# Patient Record
Sex: Male | Born: 1985 | Race: Black or African American | Hispanic: No | Marital: Single | Smoking: Never smoker
Health system: Southern US, Community
[De-identification: ages and names within clinical notes are randomized; demographics above are authoritative.]

---
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
## 2007-01-10 ENCOUNTER — Emergency Department (HOSPITAL_COMMUNITY): Admission: EM | Admit: 2007-01-10 | Discharge: 2007-01-10 | Payer: Self-pay | Admitting: Emergency Medicine

## 2010-03-15 ENCOUNTER — Emergency Department (HOSPITAL_COMMUNITY): Admission: EM | Admit: 2010-03-15 | Discharge: 2010-03-15 | Payer: Self-pay | Admitting: Emergency Medicine

## 2011-02-17 LAB — I-STAT 8, (EC8 V) (CONVERTED LAB)
Bicarbonate: 27 — ABNORMAL HIGH
Chloride: 104
Chloride: 104
HCT: 44
HCT: 44
Hemoglobin: 15
Operator id: 277751
Sodium: 140
TCO2: 29
TCO2: 29
pCO2, Ven: 52.4 — ABNORMAL HIGH
pH, Ven: 7.32 — ABNORMAL HIGH

## 2011-02-17 LAB — POCT I-STAT CREATININE
Creatinine, Ser: 1.2
Operator id: 277751

## 2011-02-17 LAB — DIFFERENTIAL
Basophils Absolute: 0.1
Basophils Relative: 2 — ABNORMAL HIGH
Eosinophils Absolute: 0
Neutrophils Relative %: 51

## 2011-02-17 LAB — CBC
MCHC: 33.3
MCV: 88.7
Platelets: 252
WBC: 3.7 — ABNORMAL LOW

## 2011-02-17 LAB — POCT CARDIAC MARKERS: Myoglobin, poc: 49.2

## 2015-05-05 ENCOUNTER — Emergency Department (HOSPITAL_COMMUNITY)
Admission: EM | Admit: 2015-05-05 | Discharge: 2015-05-05 | Disposition: A | Discharge status: Home / Self Care | Payer: Worker's Compensation | Attending: Physician Assistant | Admitting: Physician Assistant

## 2015-05-05 ENCOUNTER — Encounter (HOSPITAL_COMMUNITY): Payer: Self-pay | Admitting: *Deleted

## 2015-05-05 ENCOUNTER — Emergency Department (HOSPITAL_COMMUNITY): Payer: Worker's Compensation

## 2015-05-05 DIAGNOSIS — Y9389 Activity, other specified: Secondary | ICD-10-CM | POA: Insufficient documentation

## 2015-05-05 DIAGNOSIS — W230XXA Caught, crushed, jammed, or pinched between moving objects, initial encounter: Secondary | ICD-10-CM | POA: Diagnosis not present

## 2015-05-05 DIAGNOSIS — Y990 Civilian activity done for income or pay: Secondary | ICD-10-CM | POA: Insufficient documentation

## 2015-05-05 DIAGNOSIS — S62609A Fracture of unspecified phalanx of unspecified finger, initial encounter for closed fracture: Secondary | ICD-10-CM

## 2015-05-05 DIAGNOSIS — S62522A Displaced fracture of distal phalanx of left thumb, initial encounter for closed fracture: Secondary | ICD-10-CM | POA: Diagnosis not present

## 2015-05-05 DIAGNOSIS — S6992XA Unspecified injury of left wrist, hand and finger(s), initial encounter: Secondary | ICD-10-CM | POA: Diagnosis present

## 2015-05-05 DIAGNOSIS — Y9289 Other specified places as the place of occurrence of the external cause: Secondary | ICD-10-CM | POA: Insufficient documentation

## 2015-05-05 MED ORDER — HYDROCODONE-ACETAMINOPHEN 5-325 MG PO TABS
1.0000 | ORAL_TABLET | Freq: Once | ORAL | Status: AC
  Administered 2015-05-05: 1 via ORAL
  Filled 2015-05-05: qty 1

## 2015-05-05 MED ORDER — HYDROCODONE-ACETAMINOPHEN 5-325 MG PO TABS: ORAL_TABLET | ORAL | Status: DC

## 2015-05-05 NOTE — ED Provider Notes (Signed)
CSN: [id number]     Arrival date & time 05/05/15  1806 History  By signing my name below, I, Hector Wright, attest that this documentation has been prepared under the direction and in the presence of [employer] Trentyn Boisclair, PA-C. Electronically Signed: Evon Slackerrance Wright, ED Scribe. 05/05/2015. 9:27 PM.    Chief Complaint  Patient presents with  . Hand Injury   The history is provided by the patient. No language interpreter was used.   HPI Comments: Hector Wright is a 29 y.o. male who presents to the Emergency Department complaining of left thumb crush injury onset 1 day prior. Pt states he was unloading a truck at work and he was bringing the unloading ramp into the truck crushing his thumb. Pt states that he has associated swelling to the thumb. Pt presents with a blister on the thumb as well. He states that his pain is worse with any movement of the thumb. Pt states that he is right hand dominant. Pt denies any medications PTA. Pt denies numbness or tingling.   History reviewed. No pertinent past medical history. History reviewed. No pertinent past surgical history. No family history on file. Social History  Substance Use Topics  . Smoking status: Never Smoker   . Smokeless tobacco: None  . Alcohol Use: No    Review of Systems A complete 10 system review of systems was obtained and all systems are negative except as noted in the HPI and PMH.    Allergies  Review of patient's allergies indicates not on file.  Home Medications   Prior to Admission medications   Medication Sig Start Date End Date Taking? Authorizing Provider  HYDROcodone-acetaminophen (NORCO/VICODIN) 5-325 MG tablet Take 1-2 tablets by mouth every 6 hours as needed for pain and/or cough. 05/05/15   Hector Wilkerson, PA-C   BP 143/70 mmHg  Pulse 76  Temp(Src) 98.5 F (36.9 C) (Oral)  Resp 16  SpO2 100%   Physical Exam  Constitutional: He is oriented to person, place, and time. He appears well-developed and  well-nourished. No distress.  HENT:  Head: Normocephalic and atraumatic.  Eyes: Conjunctivae and EOM are normal.  Neck: Neck supple. No tracheal deviation present.  Cardiovascular: Normal rate.   Pulmonary/Chest: Effort normal. No respiratory distress.  Musculoskeletal: Normal range of motion. He exhibits edema and tenderness.       Hands: Neurological: He is alert and oriented to person, place, and time.  Skin: Skin is warm and dry.  Psychiatric: He has a normal mood and affect. His behavior is normal.  Nursing note and vitals reviewed.   ED Course  Procedures (including critical care time) DIAGNOSTIC STUDIES: Oxygen Saturation is 100% on RA, normal by my interpretation.    COORDINATION OF CARE: 8:58 PM-Discussed treatment plan with pt at bedside and pt agreed to plan.     Labs Review Labs Reviewed - No data to display  Imaging Review Dg Hand Complete Left  05/05/2015  CLINICAL DATA:  Hyperextension of the thumb with pain and swelling EXAM: LEFT HAND - COMPLETE 3+ VIEW COMPARISON:  None. FINDINGS: There is a fracture through the base of the first distal phalanx which appears comminuted and extends into the joint space. No other focal abnormality is noted. IMPRESSION: First distal phalangeal fracture. Electronically Signed   By: Hector CleverMark  Wright M.D.   On: 05/05/2015 19:45     EKG Interpretation None      MDM   Final diagnoses:  Phalanx (hand) fracture, closed, initial encounter  Filed Vitals:   05/05/15 1823  BP: 143/70  Pulse: 76  Temp: 98.5 F (36.9 C)  TempSrc: Oral  Resp: 16  SpO2: 100%    Medications  HYDROcodone-acetaminophen (NORCO/VICODIN) 5-325 MG per tablet 1 tablet (1 tablet Oral Given 05/05/15 2119)    Hector Wright is 29 y.o. male presenting with crush injury to left interphalangeal joint occurring yesterday. There is a blister over the joint with no laceration. X-ray shows the comminuted fracture extends into the joint.  Reconsult from  Dr. Mina Wright appreciated: States that patient can be placed in a standard splint and followed up in the office next week. Counseled patient on elevation of the extremity, return precautions. Work note provided.  Evaluation does not show pathology that would require ongoing emergent intervention or inpatient treatment. Pt is hemodynamically stable and mentating appropriately. Discussed findings and plan with patient/guardian, who agrees with care plan. All questions answered. Return precautions discussed and outpatient follow up given.   New Prescriptions   HYDROCODONE-ACETAMINOPHEN (NORCO/VICODIN) 5-325 MG TABLET    Take 1-2 tablets by mouth every 6 hours as needed for pain and/or cough.    I personally performed the services described in this documentation, which was scribed in my presence.  The recorded information has been reviewed and is accurate.      Hector Emery, PA-C 05/05/15 2130  Hector Randall An, MD 05/06/15 [phone]

## 2015-05-05 NOTE — Discharge Instructions (Signed)
Rest, Ice intermittently (in the first 24-48 hours), Gentle compression with an Ace wrap, and elevate (Limb above the level of the heart)   Take vicodin for breakthrough pain, do not drink alcohol, drive, care for children or do other critical tasks while taking vicodin.   Finger Fracture Fractures of fingers are breaks in the bones of the fingers. There are many types of fractures. There are different ways of treating these fractures. Your health care provider will discuss the best way to treat your fracture. CAUSES Traumatic injury is the main cause of broken fingers. These include:  Injuries while playing sports.  Workplace injuries.  Falls. RISK FACTORS Activities that can increase your risk of finger fractures include:  Sports.  Workplace activities that involve machinery.  A condition called osteoporosis, which can make your bones less dense and cause them to fracture more easily. SIGNS AND SYMPTOMS The main symptoms of a broken finger are pain and swelling within 15 minutes after the injury. Other symptoms include:  Bruising of your finger.  Stiffness of your finger.  Numbness of your finger.  Exposed bones (compound fracture) if the fracture is severe. DIAGNOSIS  The best way to diagnose a broken bone is with X-ray imaging. Additionally, your health care provider will use this X-ray image to evaluate the position of the broken finger bones.  TREATMENT  Finger fractures can be treated with:   Nonreduction--This means the bones are in place. The finger is splinted without changing the positions of the bone pieces. The splint is usually left on for about a week to 10 days. This will depend on your fracture and what your health care provider thinks.  Closed reduction--The bones are put back into position without using surgery. The finger is then splinted.  Open reduction and internal fixation--The fracture site is opened. Then the bone pieces are fixed into place with  pins or some type of hardware. This is seldom required. It depends on the severity of the fracture. HOME CARE INSTRUCTIONS   Follow your health care provider's instructions regarding activities, exercises, and physical therapy.  Only take over-the-counter or prescription medicines for pain, discomfort, or fever as directed by your health care provider. SEEK MEDICAL CARE IF: You have pain or swelling that limits the motion or use of your fingers. SEEK IMMEDIATE MEDICAL CARE IF:  Your finger becomes numb. MAKE SURE YOU:   Understand these instructions.  Will watch your condition.  Will get help right away if you are not doing well or get worse.   This information is not intended to replace advice given to you by your health care provider. Make sure you discuss any questions you have with your health care provider.   Document Released: 08/06/2000 Document Revised: 02/12/2013 Document Reviewed: 12/04/2012 Elsevier Interactive Patient Education [employer]2016 Elsevier Inc.

## 2015-05-05 NOTE — ED Notes (Signed)
Pt states he injured his thumb yesterday on a piece of equipment on his truck. Pt states he overextended his thumb. Pt has blister on thumb. Pt has limited ROM in his thumb.

## 2017-08-11 IMAGING — CR DG HAND COMPLETE 3+V*L*
3 series · 3 of 3 positions shown · non-contrast
Comparison: None.

CLINICAL DATA: Hyperextension of the thumb with pain and swelling

EXAM:
LEFT HAND - COMPLETE 3+ VIEW

[x hand pa left]
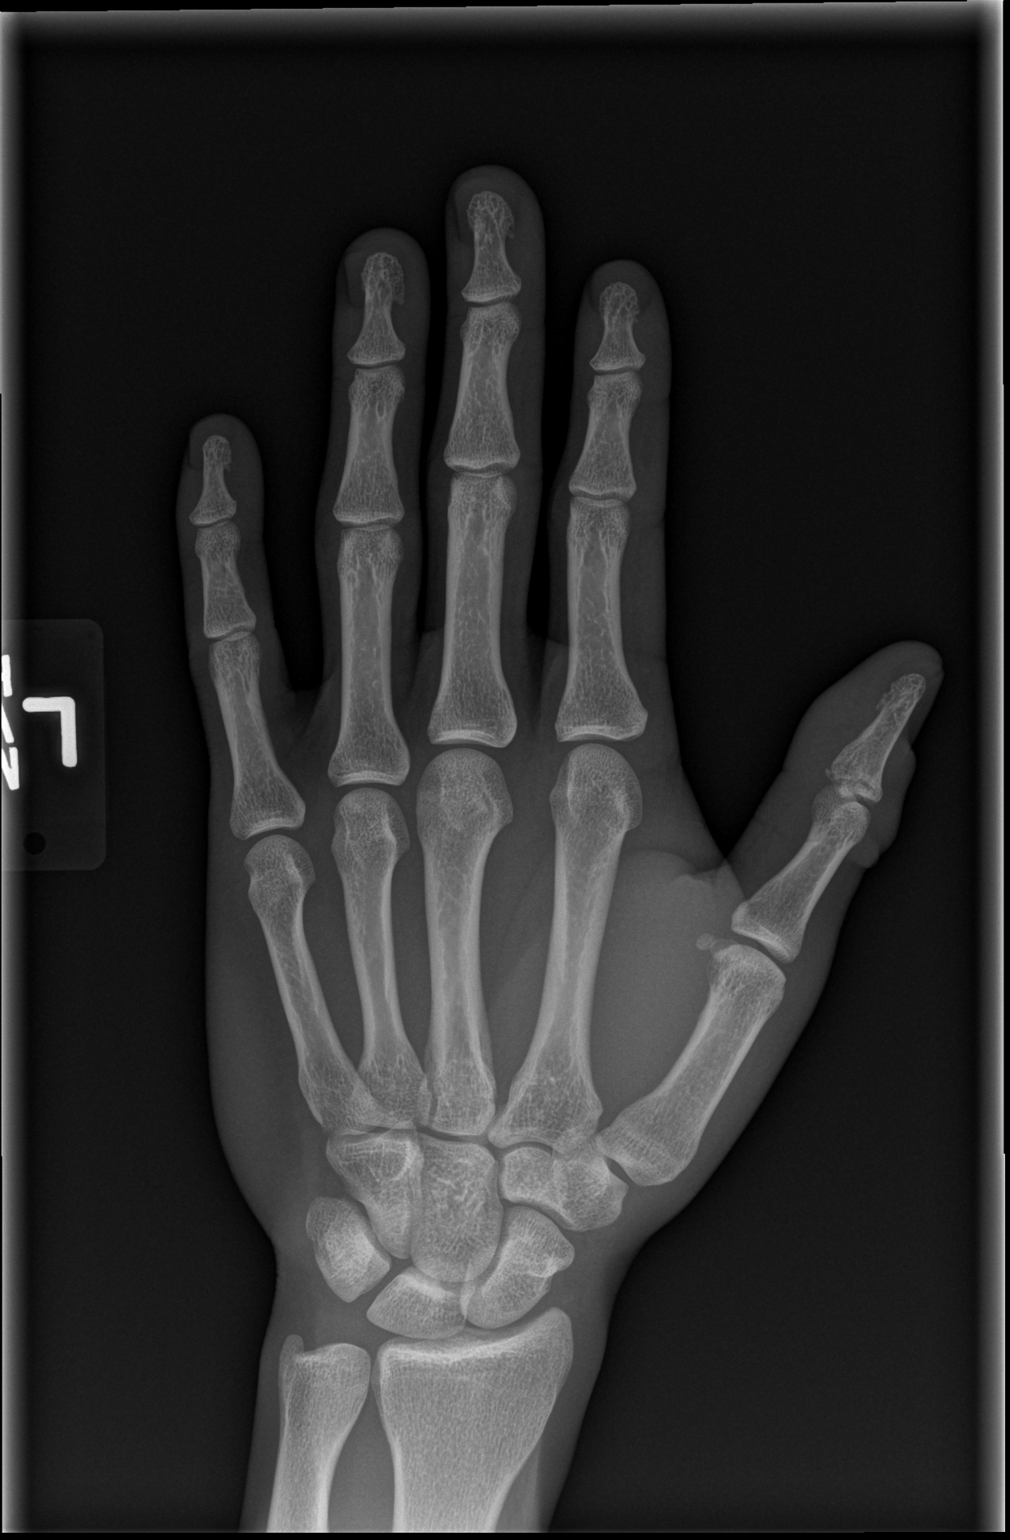

[x hand obl left]
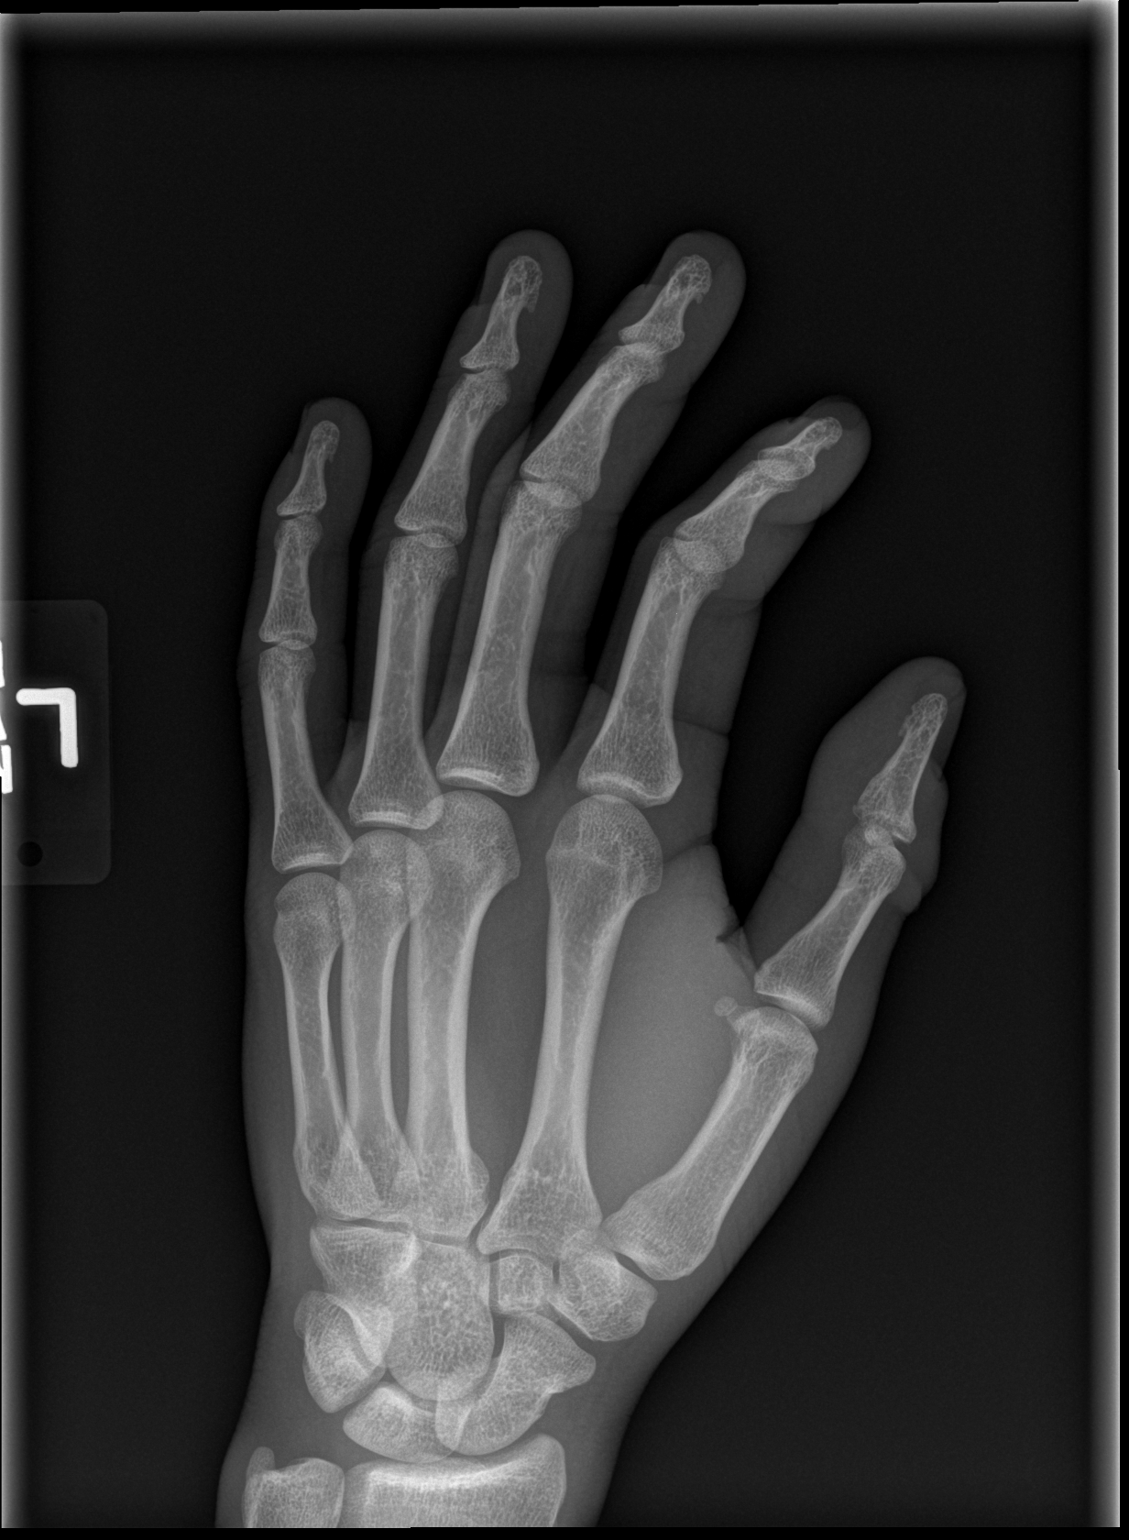

[x hand lat left]
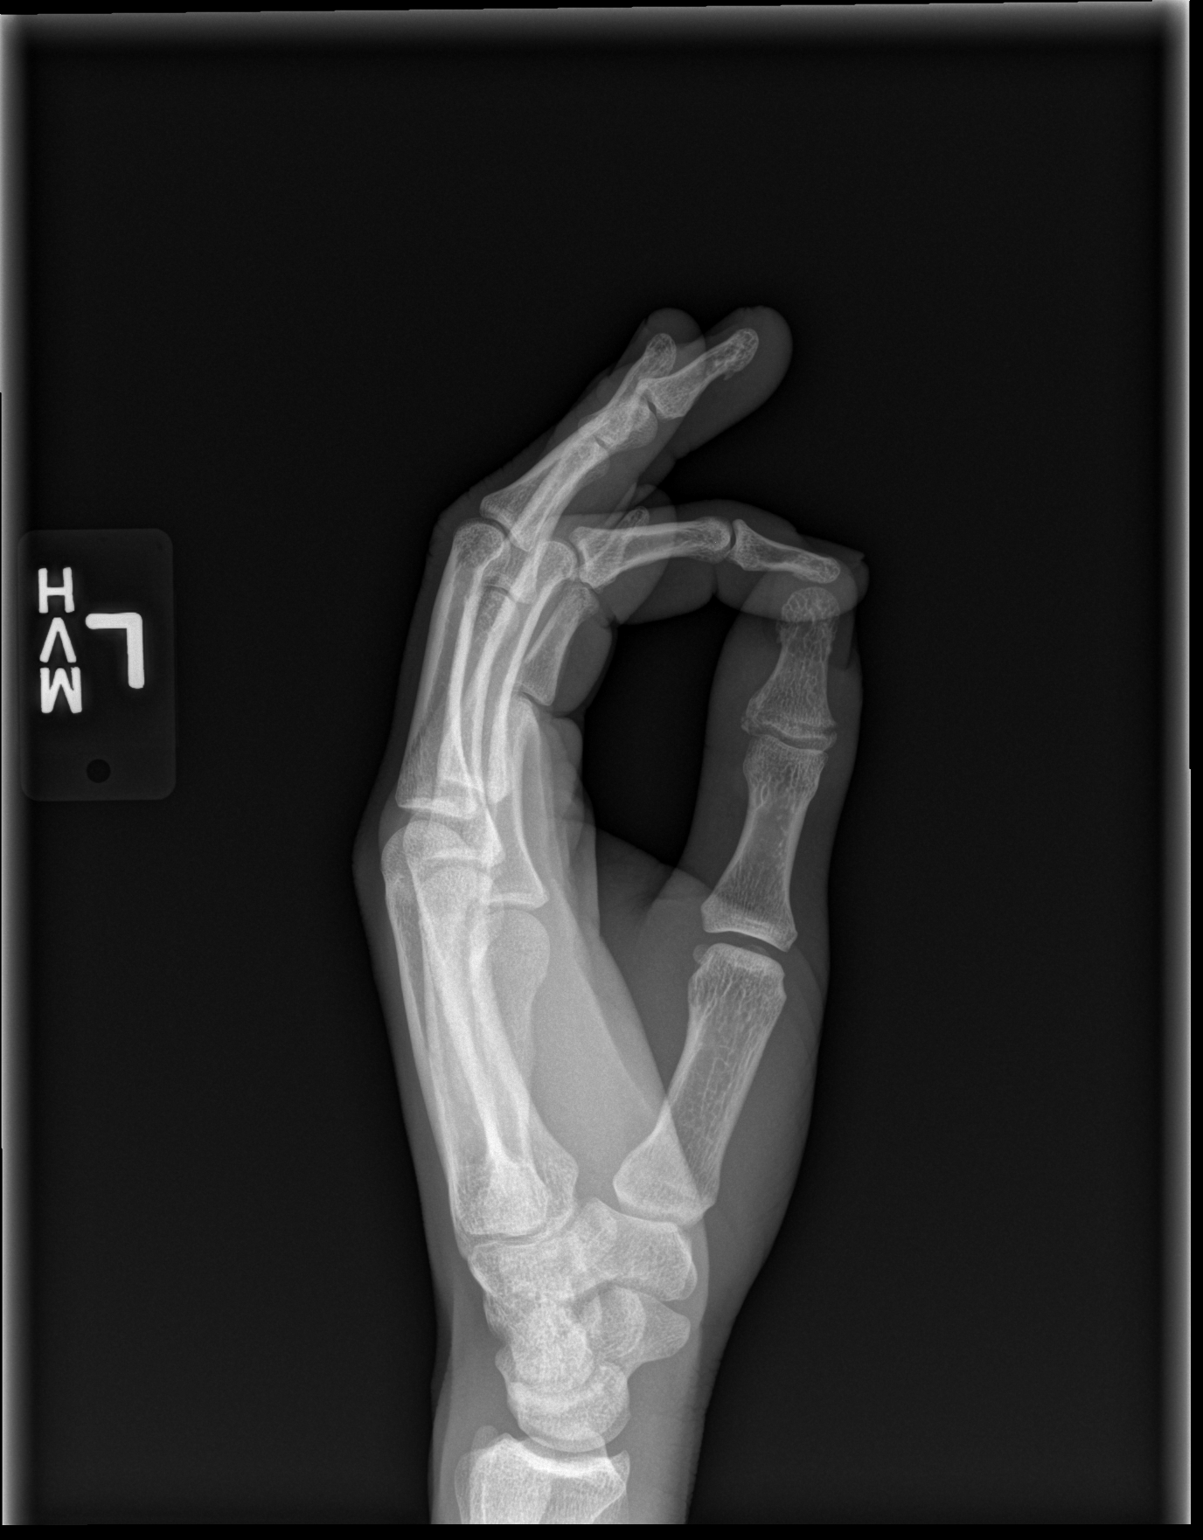

[3 of 3 positions shown; findings below may reference images not displayed]

FINDINGS: There is a fracture through the base of the first distal phalanx
which appears comminuted and extends into the joint space. No other
focal abnormality is noted.
IMPRESSION: First distal phalangeal fracture.

## 2019-12-18 ENCOUNTER — Encounter (HOSPITAL_COMMUNITY): Payer: Self-pay | Admitting: Psychiatry

## 2019-12-18 ENCOUNTER — Other Ambulatory Visit: Payer: Self-pay

## 2019-12-18 ENCOUNTER — Ambulatory Visit (INDEPENDENT_AMBULATORY_CARE_PROVIDER_SITE_OTHER): Payer: Medicaid Other | Admitting: *Deleted

## 2019-12-18 ENCOUNTER — Ambulatory Visit (INDEPENDENT_AMBULATORY_CARE_PROVIDER_SITE_OTHER): Payer: Medicaid Other | Admitting: Psychiatry

## 2019-12-18 VITALS — BP 132/90 | HR 69 | Temp 98.7°F | Ht 70.0 in | Wt 151.5 lb

## 2019-12-18 DIAGNOSIS — F250 Schizoaffective disorder, bipolar type: Secondary | ICD-10-CM | POA: Insufficient documentation

## 2019-12-18 MED ORDER — ARIPIPRAZOLE ER 400 MG IM PRSY: 400.0000 mg | PREFILLED_SYRINGE | INTRAMUSCULAR | 11 refills | Status: DC

## 2019-12-18 MED ORDER — ARIPIPRAZOLE ER 400 MG IM PRSY
400.0000 mg | PREFILLED_SYRINGE | INTRAMUSCULAR | Status: AC
  Administered 2019-12-18 – 2020-11-09 (×9): 400 mg via INTRAMUSCULAR

## 2019-12-18 NOTE — Progress Notes (Signed)
Psychiatric Initial Adult Assessment   Patient Identification: Hector Wright MRN:  [id number] Date of Evaluation:  12/18/2019   Referral Source: [facility] [place].  Chief Complaint:   " I am doing well."  Visit Diagnosis:    ICD-10-CM   1. Schizoaffective disorder, bipolar type (HCC)  F25.0 ARIPiprazole ER (ABILIFY MAINTENA) 400 MG PRSY prefilled syringe    ARIPiprazole ER (ABILIFY MAINTENA) 400 MG prefilled syringe 400 mg    History of Present Illness: This is a 34 year old male with history of schizoaffective disorder now seen for establishing care.  Patient reported that he has been on Abilify Maintena IM 400 mg dose for about 2 years now.  He was getting monthly injections at his previous outpatient psychiatry practice at [facility] [place].  He stated that he developed psychotic and mood symptoms about 5 to 6 years ago.  He started receiving services at [facility] [place] around that time and was started on oral Abilify.  He informed that he did well on the oral Abilify and was eventually started on monthly IM Abilify Maintena injections. He reported that he used to have auditory and visual hallucinations.  He also used to have paranoid delusions.  He also reported having ideas of reference.  He informed that all this improved after he was started on oral Abilify.  His symptoms have been in remission for the past couple of years now. Regarding mood, patient stated that he sometimes does get irritable and may get angry.  He stated that when he gets angry he usually walks out and may take a few things in the yard.  He denied throwing things at others or hurting others when agitated.  He was asked to give an example of an incident that made him irritable lately.  He stated he was irritable and angry when he lost a game of basketball a few days ago and he felt that the other side was cheating.  He stated that he calmed down a few minutes later and was okay then. Patient  denied any issues pertaining to his sleep or appetite patterns.  He informed that he receives disability.  He does work part-time as a Engineer, water on the side. He informed that he lives with his mother and she is very supportive of him. Regarding transportation, he stated that he uses Medicaid van for his appointments.  He denied any suicidal or homicidal ideations.  He denied excessive consumption of alcohol or illicit use of drugs.  Past Psychiatric History: Schizoaffective disorder, denied any history of hospitalizations.  Was receiving outpatient services from [facility] [place].  Previous Psychotropic Medications: Yes   Substance Abuse History in the last 12 months:  No.  Consequences of Substance Abuse: NA  Past Medical History: No past medical history on file. No past surgical history on file.  Family Psychiatric History: denied  Family History: No family history on file.  Social History:   Social History   Socioeconomic History   Marital status: Single    Spouse name: Not on file   Number of children: Not on file   Years of education: Not on file   Highest education level: Not on file  Occupational History   Not on file  Tobacco Use   Smoking status: Never Smoker  Substance and Sexual Activity   Alcohol use: No   Drug use: Not on file   Sexual activity: Not on file  Other Topics Concern   Not on file  Social History Narrative   Not on file   Social Determinants of Health   Financial Resource Strain:    Difficulty of Paying Living Expenses:   Food Insecurity:    Worried About Programme researcher, broadcasting/film/video in the Last Year:    Barista in the Last Year:   Transportation Needs:    Freight forwarder (Medical):    Lack of Transportation (Non-Medical):   Physical Activity:    Days of Exercise per Week:    Minutes of Exercise per Session:   Stress:    Feeling of Stress :   Social Connections:    Frequency of Communication with  Friends and Family:    Frequency of Social Gatherings with Friends and Family:    Attends Religious Services:    Active Member of Clubs or Organizations:    Attends Banker Meetings:    Marital Status:     Additional Social History: Patient reported that he lives with his mother, is on disability.  Allergies:  Not on File  Metabolic Disorder Labs: No results found for: HGBA1C, MPG No results found for: PROLACTIN No results found for: CHOL, TRIG, HDL, CHOLHDL, VLDL, LDLCALC No results found for: TSH  Therapeutic Level Labs: No results found for: LITHIUM No results found for: CBMZ No results found for: VALPROATE  Current Medications: Current Outpatient Medications  Medication Sig Dispense Refill   ARIPiprazole ER (ABILIFY MAINTENA) 400 MG PRSY prefilled syringe Inject 400 mg into the muscle every 30 (thirty) days. 1 each 11   Current Facility-Administered Medications  Medication Dose Route Frequency Provider Last Rate Last Admin   ARIPiprazole ER (ABILIFY MAINTENA) 400 MG prefilled syringe 400 mg  400 mg Intramuscular Q30 days Zena Amos, MD        Musculoskeletal: Strength & Muscle Tone: within normal limits Gait & Station: normal Patient leans: N/A  Psychiatric Specialty Exam: Review of Systems  There were no vitals taken for this visit.There is no height or weight on file to calculate BMI.  General Appearance: Fairly Groomed  Eye Contact:  Good  Speech:  Clear and Coherent and Normal Rate  Volume:  Normal  Mood:  Euthymic  Affect:  Restricted  Thought Process:  Goal Directed and Descriptions of Associations: Intact  Orientation:  Full (Time, Place, and Person)  Thought Content:  Logical  Suicidal Thoughts:  No  Homicidal Thoughts:  No  Memory:  Immediate;   Good Recent;   Good Remote;   Good  Judgement:  Fair  Insight:  Fair  Psychomotor Activity:  Normal  Concentration:  Concentration: Good and Attention Span: Good  Recall:  Good   Fund of Knowledge:Good  Language: Good  Akathisia:  Negative  Handed:  Right  AIMS (if indicated): 0  Assets:  Communication Skills Desire for Improvement Financial Resources/Insurance Housing Social Support Talents/Skills  ADL's:  Intact  Cognition: WNL  Sleep:  Good     Assessment and Plan: Based on patient's history and assessment patient seems to be stable on his current regimen of IM Abilify Maintena 400 mg monthly injections. Recommend that he continues the same regimen.   1. Schizoaffective disorder, bipolar type (HCC) - ARIPiprazole ER (ABILIFY MAINTENA) 400 MG PRSY prefilled syringe; Inject 400 mg into the muscle every 30 (thirty) days.  Dispense: 1 each; Refill: 11. Dose administered today. - ARIPiprazole ER (ABILIFY MAINTENA) 400 MG prefilled syringe 400 mg  Continue same medication regimen. Follow up in 2 months. Return back next month  for monthly injectable.   Zena Amos, MD 8/12/20219:14 AM

## 2019-12-18 NOTE — Progress Notes (Signed)
Patient arrived for New Patient Appointment & Injection. Patient is very pleasant . Abilify Maintena 400 mg  Injection tolerated well in Right arm.

## 2020-01-16 ENCOUNTER — Encounter (HOSPITAL_COMMUNITY): Payer: Self-pay

## 2020-01-16 ENCOUNTER — Other Ambulatory Visit: Payer: Self-pay

## 2020-01-16 ENCOUNTER — Ambulatory Visit (INDEPENDENT_AMBULATORY_CARE_PROVIDER_SITE_OTHER): Payer: Medicaid Other | Admitting: *Deleted

## 2020-01-16 VITALS — BP 128/81 | HR 78 | Ht 70.0 in | Wt 149.0 lb

## 2020-01-16 DIAGNOSIS — F250 Schizoaffective disorder, bipolar type: Secondary | ICD-10-CM | POA: Diagnosis not present

## 2020-01-16 NOTE — Progress Notes (Signed)
Patient arrived today for Monthly Injection. Patient as always very Pleasant. Tolerated injection well in Left-Arm.

## 2020-02-13 ENCOUNTER — Ambulatory Visit (HOSPITAL_COMMUNITY): Payer: Medicaid Other

## 2020-02-16 ENCOUNTER — Ambulatory Visit (HOSPITAL_COMMUNITY): Payer: Medicaid Other | Admitting: *Deleted

## 2020-02-16 ENCOUNTER — Encounter (HOSPITAL_COMMUNITY): Payer: Self-pay

## 2020-02-16 ENCOUNTER — Other Ambulatory Visit: Payer: Self-pay

## 2020-02-16 VITALS — BP 121/70 | HR 82 | Temp 98.8°F | Ht 70.0 in | Wt 154.0 lb

## 2020-02-16 DIAGNOSIS — F250 Schizoaffective disorder, bipolar type: Secondary | ICD-10-CM

## 2020-02-16 NOTE — Progress Notes (Signed)
Patient arrived today for his Injection. pleasant as always. Injection tolerated well in Right-Arm

## 2020-02-17 ENCOUNTER — Ambulatory Visit (HOSPITAL_COMMUNITY): Payer: Medicaid Other | Admitting: Psychiatry

## 2020-03-15 ENCOUNTER — Ambulatory Visit (HOSPITAL_COMMUNITY): Payer: Medicaid Other | Admitting: *Deleted

## 2020-03-15 ENCOUNTER — Other Ambulatory Visit: Payer: Self-pay

## 2020-03-15 ENCOUNTER — Encounter (HOSPITAL_COMMUNITY): Payer: Self-pay

## 2020-03-15 VITALS — BP 122/77 | HR 80 | Ht 70.0 in | Wt 153.0 lb

## 2020-03-15 DIAGNOSIS — F250 Schizoaffective disorder, bipolar type: Secondary | ICD-10-CM

## 2020-03-15 NOTE — Progress Notes (Signed)
Seen for his scheduled injection appt today. He is pleasant, verbal and appropriate. He brings his injection with him as he has medicaid. Today received Abilify 400 mg in L deltoid without difficulty. He is on his way to work from here, he cleans thru a service. No complaints voiced. Return in one month for his next injection

## 2020-04-12 ENCOUNTER — Encounter (HOSPITAL_COMMUNITY): Payer: Self-pay

## 2020-04-12 ENCOUNTER — Other Ambulatory Visit: Payer: Self-pay

## 2020-04-12 ENCOUNTER — Ambulatory Visit (HOSPITAL_COMMUNITY): Payer: Medicaid Other | Admitting: *Deleted

## 2020-04-12 VITALS — BP 139/77 | HR 93 | Ht 70.0 in | Wt 139.0 lb

## 2020-04-12 DIAGNOSIS — F250 Schizoaffective disorder, bipolar type: Secondary | ICD-10-CM

## 2020-04-12 NOTE — Progress Notes (Signed)
In as scheduled for his monthly injection. Today he received his Abilify 400 mg in R deltoid without difficulty. He is his usual bright, talkative, pleasant and appropriate. He denies any psychotic sx. Reports having a nice Thanksgiving holiday in [place] with family and states he is going to [facility] [place] for [facility]. He is to return in one month.

## 2020-05-11 ENCOUNTER — Telehealth (INDEPENDENT_AMBULATORY_CARE_PROVIDER_SITE_OTHER): Payer: Medicaid Other | Admitting: Psychiatry

## 2020-05-11 ENCOUNTER — Ambulatory Visit (HOSPITAL_COMMUNITY): Payer: Medicaid Other

## 2020-05-11 ENCOUNTER — Other Ambulatory Visit: Payer: Self-pay

## 2020-05-11 ENCOUNTER — Encounter (HOSPITAL_COMMUNITY): Payer: Self-pay | Admitting: Psychiatry

## 2020-05-11 DIAGNOSIS — F250 Schizoaffective disorder, bipolar type: Secondary | ICD-10-CM

## 2020-05-11 NOTE — Progress Notes (Signed)
BH OP Progress Note  Virtual Visit via Telephone Note  I connected with Hector Wright on 05/11/20 at  2:20 PM EST by telephone and verified that I am speaking with the correct person using two identifiers.  Location: Patient: home Provider: Clinic   I discussed the limitations, risks, security and privacy concerns of performing an evaluation and management service by telephone and the availability of in person appointments. I also discussed with the patient that there may be a patient responsible charge related to this service. The patient expressed understanding and agreed to proceed.   I provided 15 minutes of non-face-to-face time during this encounter.   Patient Identification: KNOWLEDGE ESCANDON MRN:  [id number] Date of Evaluation:  05/11/2020    Chief Complaint:   " I am doing well."  Visit Diagnosis:    ICD-10-CM   1. Schizoaffective disorder, bipolar type (HCC)  F25.0     History of Present Illness: Patient reported he is doing well.  He informed that he continues to get his injection every month.  This was verified as per the EMR.  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  He still lives with his mother. He denies any psychotic or mood symptoms.  He reportedly sleeps pretty well at night. When asked if he had any specific resolutions for the new year, he did not and said he wants a lot of new things.   He denies any acute issues or concerns at this time.  Past Psychiatric History: Schizoaffective disorder, denied any history of hospitalizations.  Was receiving outpatient services from [facility] [place].  Previous Psychotropic Medications: Yes   Substance Abuse History in the last 12 months:  No.  Consequences of Substance Abuse: NA  Past Medical History: History reviewed. No pertinent past medical history. History reviewed. No pertinent surgical history.  Family Psychiatric History: denied  Family History: History reviewed. No  pertinent family history.  Social History:   Social History   Socioeconomic History  . Marital status: Single    Spouse name: Not on file  . Number of children: Not on file  . Years of education: Not on file  . Highest education level: Not on file  Occupational History  . Not on file  Tobacco Use  . Smoking status: Never Smoker  . Smokeless tobacco: Never Used  Substance and Sexual Activity  . Alcohol use: No  . Drug use: Never  . Sexual activity: Not Currently  Other Topics Concern  . Not on file  Social History Narrative  . Not on file   Social Determinants of Health   Financial Resource Strain: Not on file  Food Insecurity: Not on file  Transportation Needs: Not on file  Physical Activity: Not on file  Stress: Not on file  Social Connections: Not on file    Additional Social History: Patient reported that he lives with his mother, is on disability.  Allergies:  Not on File  Metabolic Disorder Labs: No results found for: HGBA1C, MPG No results found for: PROLACTIN No results found for: CHOL, TRIG, HDL, CHOLHDL, VLDL, LDLCALC No results found for: TSH  Therapeutic Level Labs: No results found for: LITHIUM No results found for: CBMZ No results found for: VALPROATE  Current Medications: Current Outpatient Medications  Medication Sig Dispense Refill  . ARIPiprazole ER (ABILIFY MAINTENA) 400 MG PRSY prefilled syringe Inject 400 mg into the muscle every 30 (thirty) days. 1 each 11   Current Facility-Administered  Medications  Medication Dose Route Frequency Provider Last Rate Last Admin  . ARIPiprazole ER (ABILIFY MAINTENA) 400 MG prefilled syringe 400 mg  400 mg Intramuscular Q30 days Zena Amos, MD   400 mg at 04/12/20 0902    Musculoskeletal: Strength & Muscle Tone: within normal limits Gait & Station: normal Patient leans: N/A  Psychiatric Specialty Exam: Review of Systems  There were no vitals taken for this visit.There is no height or weight on  file to calculate BMI.  General Appearance: Unable to assess due to phone  Eye Contact:  unable to assess due to phone visit  Speech:  Clear and Coherent and Normal Rate  Volume:  Normal  Mood:  Euthymic  Affect:  Restricted  Thought Process:  Goal Directed and Descriptions of Associations: Intact  Orientation:  Full (Time, Place, and Person)  Thought Content:  Logical  Suicidal Thoughts:  No  Homicidal Thoughts:  No  Memory:  Immediate;   Good Recent;   Good Remote;   Good  Judgement:  Fair  Insight:  Fair  Psychomotor Activity:  Normal  Concentration:  Concentration: Good and Attention Span: Good  Recall:  Good  Fund of Knowledge:Good  Language: Good  Akathisia:  Negative  Handed:  Right  AIMS (if indicated): 0  Assets:  Communication Skills Desire for Improvement Financial Resources/Insurance Housing Social Support Talents/Skills  ADL's:  Intact  Cognition: WNL  Sleep:  Good     Assessment and Plan: Patient appears to be stable on his current regimen of monthly IM Abilify Maintena injections.  1. Schizoaffective disorder, bipolar type (HCC) - ARIPiprazole ER (ABILIFY MAINTENA) 400 MG PRSY prefilled syringe; Inject 400 mg into the muscle every 30 (thirty) days.  Dispense: 1 each; Refill: 11.  - ARIPiprazole ER (ABILIFY MAINTENA) 400 MG prefilled syringe 400 mg  Continue same medication regimen. Follow up in 3 months.  Patient is scheduled to come for his monthly IM Abilify injection dose tomorrow morning.  Patient is aware of that.   Zena Amos, MD 1/4/20222:26 PM

## 2020-05-12 ENCOUNTER — Encounter (HOSPITAL_COMMUNITY): Payer: Self-pay

## 2020-05-12 ENCOUNTER — Other Ambulatory Visit: Payer: Self-pay

## 2020-05-12 ENCOUNTER — Ambulatory Visit (HOSPITAL_COMMUNITY): Payer: Medicaid Other | Admitting: *Deleted

## 2020-05-12 VITALS — BP 121/76 | HR 66 | Ht 70.0 in | Wt 150.0 lb

## 2020-05-12 DIAGNOSIS — F250 Schizoaffective disorder, bipolar type: Secondary | ICD-10-CM

## 2020-05-12 NOTE — Progress Notes (Signed)
In as scheduled for his monthly injection of Abilify 400 mg IM today got his shot in his L deltoid. He had a virtual appt with Dr Evelene Croon yesterday. He is in good spirits, he is his usual pleasant and verbal self. States he had nice holidays with his family and got some money for Christmas. He states his New Years goal is to stop smoking. He smells strongly of marijuana but states he is at least going to stop cigarettes. He continues to work, currently works in [employer]. No complaints offered. To return in one month for his next injection.

## 2020-05-19 ENCOUNTER — Other Ambulatory Visit: Payer: Self-pay

## 2020-06-09 ENCOUNTER — Ambulatory Visit (HOSPITAL_COMMUNITY): Payer: Medicaid Other

## 2020-06-11 ENCOUNTER — Other Ambulatory Visit: Payer: Self-pay

## 2020-06-11 ENCOUNTER — Encounter (HOSPITAL_COMMUNITY): Payer: Self-pay

## 2020-06-11 ENCOUNTER — Ambulatory Visit (INDEPENDENT_AMBULATORY_CARE_PROVIDER_SITE_OTHER): Payer: Medicaid Other | Admitting: *Deleted

## 2020-06-11 VITALS — BP 126/72 | HR 65 | Ht 70.0 in | Wt 160.0 lb

## 2020-06-11 DIAGNOSIS — F250 Schizoaffective disorder, bipolar type: Secondary | ICD-10-CM

## 2020-06-11 NOTE — Progress Notes (Signed)
PATIENT ARRIVED TODAY FOR INJECTION 400 MG ABILIFY MAINTENA. PATIENT PLEASANT AS ALWAYS. SPOKE OF EXCITEMENT FOR THE SUPER BOWL & HALF TIME SHOW. PATIENT TOLERATED INJECTION WELL IN RIGHT-ARM. NO SI/HI NOR VH/AH

## 2020-07-09 ENCOUNTER — Ambulatory Visit (HOSPITAL_COMMUNITY): Payer: Medicaid Other

## 2020-07-12 ENCOUNTER — Ambulatory Visit (HOSPITAL_COMMUNITY): Payer: Medicaid Other | Admitting: *Deleted

## 2020-07-12 ENCOUNTER — Other Ambulatory Visit: Payer: Self-pay

## 2020-07-12 ENCOUNTER — Encounter (HOSPITAL_COMMUNITY): Payer: Self-pay

## 2020-07-12 VITALS — BP 128/80 | HR 63 | Ht 70.0 in | Wt 160.0 lb

## 2020-07-12 DIAGNOSIS — F250 Schizoaffective disorder, bipolar type: Secondary | ICD-10-CM

## 2020-07-12 NOTE — Progress Notes (Signed)
In a few days late for his monthly injection. He takes Abilify M 400 mg q 28-30 days and today had it in his L deltoid. He is in good spirits, denies any problems and says he is looking forward to staying well. States he hasnt had sx of his illness in " a long time" meaning with clarification he has been well for "months." He continues to work and likes it, he [place]. To return in one month for his next injection.

## 2020-08-10 ENCOUNTER — Other Ambulatory Visit: Payer: Self-pay

## 2020-08-10 ENCOUNTER — Telehealth (INDEPENDENT_AMBULATORY_CARE_PROVIDER_SITE_OTHER): Payer: Medicaid Other | Admitting: Psychiatry

## 2020-08-10 ENCOUNTER — Encounter (HOSPITAL_COMMUNITY): Payer: Self-pay | Admitting: Psychiatry

## 2020-08-10 DIAGNOSIS — F250 Schizoaffective disorder, bipolar type: Secondary | ICD-10-CM | POA: Diagnosis not present

## 2020-08-10 NOTE — Progress Notes (Signed)
BH OP Progress Note  Virtual Visit via Telephone Note  I connected with Soyla Dryer on 08/10/20 at 11:00 AM EDT by telephone and verified that I am speaking with the correct person using two identifiers.  Location: Patient: home Provider: Clinic   I discussed the limitations, risks, security and privacy concerns of performing an evaluation and management service by telephone and the availability of in person appointments. I also discussed with the patient that there may be a patient responsible charge related to this service. The patient expressed understanding and agreed to proceed.   I provided 9 minutes of non-face-to-face time during this encounter.     Patient Identification: Hector Wright MRN:  [id number] Date of Evaluation:  08/10/2020    Chief Complaint:   " Everything is fine"  Visit Diagnosis:    ICD-10-CM   1. Schizoaffective disorder, bipolar type (HCC)  F25.0     History of Present Illness: Patient contacted for follow-up.  He informed that he is doing well.  He stated that his mood has been stable.  He is sleeping well at night.  He informed that he has been getting his injections regularly which was verified from the EMR. He denied any specific concerns.  He denied any hallucinations or delusions.   Past Psychiatric History: Schizoaffective disorder, denied any history of hospitalizations.  Was receiving outpatient services from family services at [place].  Previous Psychotropic Medications: Yes   Substance Abuse History in the last 12 months:  No.  Consequences of Substance Abuse: NA  Past Medical History: No past medical history on file. No past surgical history on file.  Family Psychiatric History: denied  Family History: No family history on file.  Social History:   Social History   Socioeconomic History  . Marital status: Single    Spouse name: Not on file  . Number of children: Not on file  . Years of education: Not on file  . Highest  education level: Not on file  Occupational History  . Not on file  Tobacco Use  . Smoking status: Never Smoker  . Smokeless tobacco: Never Used  Substance and Sexual Activity  . Alcohol use: No  . Drug use: Never  . Sexual activity: Not Currently  Other Topics Concern  . Not on file  Social History Narrative  . Not on file   Social Determinants of Health   Financial Resource Strain: Not on file  Food Insecurity: Not on file  Transportation Needs: Not on file  Physical Activity: Not on file  Stress: Not on file  Social Connections: Not on file    Additional Social History: Patient reported that he lives with his mother, is on disability.  Allergies:  Not on File  Metabolic Disorder Labs: No results found for: HGBA1C, MPG No results found for: PROLACTIN No results found for: CHOL, TRIG, HDL, CHOLHDL, VLDL, LDLCALC No results found for: TSH  Therapeutic Level Labs: No results found for: LITHIUM No results found for: CBMZ No results found for: VALPROATE  Current Medications: Current Outpatient Medications  Medication Sig Dispense Refill  . ARIPiprazole ER (ABILIFY MAINTENA) 400 MG PRSY prefilled syringe Inject 400 mg into the muscle every 30 (thirty) days. 1 each 11   Current Facility-Administered Medications  Medication Dose Route Frequency Provider Last Rate Last Admin  . ARIPiprazole ER (ABILIFY MAINTENA) 400 MG prefilled syringe 400 mg  400 mg Intramuscular Q30 days Zena Amos, MD   400 mg at 07/12/20 [phone]  Musculoskeletal: Strength & Muscle Tone: within normal limits Gait & Station: normal Patient leans: N/A  Psychiatric Specialty Exam: Review of Systems  There were no vitals taken for this visit.There is no height or weight on file to calculate BMI.  General Appearance: Unable to assess due to phone visit  Eye Contact:  unable to assess due to phone visit  Speech:  Clear and Coherent and Normal Rate  Volume:  Normal  Mood:  Euthymic  Affect:   Restricted  Thought Process:  Goal Directed and Descriptions of Associations: Intact  Orientation:  Full (Time, Place, and Person)  Thought Content:  Logical  Suicidal Thoughts:  No  Homicidal Thoughts:  No  Memory:  Immediate;   Good Recent;   Good Remote;   Good  Judgement:  Fair  Insight:  Fair  Psychomotor Activity:  Normal  Concentration:  Concentration: Good and Attention Span: Good  Recall:  Good  Fund of Knowledge:Good  Language: Good  Akathisia:  Negative  Handed:  Right  AIMS (if indicated): 0  Assets:  Communication Skills Desire for Improvement Financial Resources/Insurance Housing Social Support Talents/Skills  ADL's:  Intact  Cognition: WNL  Sleep:  Good     Assessment and Plan: Patient is stable in terms of his psychotic symptoms on his current regimen of Abilify Maintena monthly shot.  1. Schizoaffective disorder, bipolar type (HCC) - ARIPiprazole ER (ABILIFY MAINTENA) 400 MG PRSY prefilled syringe; Inject 400 mg into the muscle every 30 (thirty) days.  Dispense: 1 each; Refill: 11.  - ARIPiprazole ER (ABILIFY MAINTENA) 400 MG prefilled syringe 400 mg  Continue same medication regimen. Follow up in 3 months.  Patient is scheduled to come for his monthly IM Abilify injection dose on April 7.  Patient is aware of that.   Zena Amos, MD 4/5/202210:15 AM

## 2020-08-12 ENCOUNTER — Encounter (HOSPITAL_COMMUNITY): Payer: Self-pay

## 2020-08-12 ENCOUNTER — Ambulatory Visit (HOSPITAL_COMMUNITY): Payer: Medicaid Other | Admitting: *Deleted

## 2020-08-12 ENCOUNTER — Other Ambulatory Visit: Payer: Self-pay

## 2020-08-12 VITALS — BP 135/77 | HR 67 | Ht 70.0 in | Wt 156.0 lb

## 2020-08-12 DIAGNOSIS — F250 Schizoaffective disorder, bipolar type: Secondary | ICD-10-CM

## 2020-08-12 NOTE — Progress Notes (Signed)
In as scheduled for his monthly injection of Abilify M 400 mg. He got his shot today in his R deltoid without difficulty. He is his usual pleasant self, verbal, bright affect, upbeat attitude. He continues to work, states everything is going well, and denies any sx of psychosis. He enjoyed watching the basketball finals this past weekend and Monday though his team didn't win. He is to return in one month for his next injection. Gave him two bus tickets to get to work and home.

## 2020-09-09 ENCOUNTER — Encounter (HOSPITAL_COMMUNITY): Payer: Self-pay

## 2020-09-09 ENCOUNTER — Ambulatory Visit (HOSPITAL_COMMUNITY): Payer: Medicaid Other | Admitting: *Deleted

## 2020-09-09 ENCOUNTER — Other Ambulatory Visit: Payer: Self-pay

## 2020-09-09 VITALS — BP 125/72 | HR 65 | Ht 70.0 in | Wt 157.0 lb

## 2020-09-09 DIAGNOSIS — F250 Schizoaffective disorder, bipolar type: Secondary | ICD-10-CM

## 2020-09-09 NOTE — Progress Notes (Signed)
In as scheduled for his monthly injection of Abilify M 400 mg. Today he got his shot in his L deltoid without difficulty. He is at his baseline, pleasant, verbal, no complaints offered. When asked he denies psychotic sx. He continues to work and he was given two bus tickets to go to work today. To return in one month for his next injection.

## 2020-10-07 ENCOUNTER — Ambulatory Visit (INDEPENDENT_AMBULATORY_CARE_PROVIDER_SITE_OTHER): Payer: Medicaid Other | Admitting: *Deleted

## 2020-10-07 ENCOUNTER — Encounter (HOSPITAL_COMMUNITY): Payer: Self-pay

## 2020-10-07 ENCOUNTER — Other Ambulatory Visit: Payer: Self-pay

## 2020-10-07 VITALS — BP 134/82 | HR 75 | Ht 70.0 in | Wt 149.0 lb

## 2020-10-07 DIAGNOSIS — F250 Schizoaffective disorder, bipolar type: Secondary | ICD-10-CM | POA: Diagnosis not present

## 2020-10-07 NOTE — Progress Notes (Signed)
Patient arrived for ARIPiprazole ER (ABILIFY MAINTENA) 400 MG . Patient shared that he had a GREAT [address] with family. Patient pleasant as always.Tolerated injection well in Right arm. NO HI/SI NOR AH/VH

## 2020-11-04 ENCOUNTER — Ambulatory Visit (HOSPITAL_COMMUNITY): Payer: Medicaid Other

## 2020-11-05 ENCOUNTER — Ambulatory Visit (HOSPITAL_COMMUNITY): Payer: Medicaid Other

## 2020-11-09 ENCOUNTER — Encounter (HOSPITAL_COMMUNITY): Payer: Self-pay

## 2020-11-09 ENCOUNTER — Ambulatory Visit (HOSPITAL_COMMUNITY): Payer: Medicaid Other | Admitting: *Deleted

## 2020-11-09 ENCOUNTER — Encounter (HOSPITAL_COMMUNITY): Payer: Self-pay | Admitting: Psychiatry

## 2020-11-09 ENCOUNTER — Other Ambulatory Visit: Payer: Self-pay

## 2020-11-09 ENCOUNTER — Telehealth (INDEPENDENT_AMBULATORY_CARE_PROVIDER_SITE_OTHER): Payer: Medicaid Other | Admitting: Psychiatry

## 2020-11-09 VITALS — BP 112/81 | HR 76 | Ht 70.0 in | Wt 154.0 lb

## 2020-11-09 DIAGNOSIS — F250 Schizoaffective disorder, bipolar type: Secondary | ICD-10-CM

## 2020-11-09 MED ORDER — ARIPIPRAZOLE ER 400 MG IM PRSY: 400.0000 mg | PREFILLED_SYRINGE | INTRAMUSCULAR | 11 refills | Status: DC

## 2020-11-09 NOTE — Progress Notes (Signed)
In as scheduled for his monthly injection of Abilify M 400 mg, given today in his L DELTOID without difficulty. He is pleasant and appropriate and offers no complaints. States he spent the 4th of July with his father and they ate and set off fireworks. He has an appt today with Dr Doyne Keel virtually he will keep. Explained to him our change to shot clinics on Tues and Thurs and he will be good on Tues as he is currently not working. To return in one month for his next injection.

## 2020-11-09 NOTE — Progress Notes (Signed)
BH MD/PA/NP OP Progress Note .Virtual Visit via Telephone Note  I connected with Hector Wright on 11/09/20 at 11:00 AM EDT by telephone and verified that I am speaking with the correct person using two identifiers.  Location: Patient: home Provider: Clinic   I discussed the limitations, risks, security and privacy concerns of performing an evaluation and management service by telephone and the availability of in person appointments. I also discussed with the patient that there may be a patient responsible charge related to this service. The patient expressed understanding and agreed to proceed.   I provided 30 minutes of non-face-to-face time during this encounter.  11/09/2020 11:01 AM Hector Wright  MRN:  [id number]  Chief Complaint: "I have been doing good"  HPI: 35 year old male seen today for follow-up psychiatric evaluation.  He has a psychiatric history of schizoaffective disorder bipolar type.  He is currently managed on Abilify maintainer 400 mg monthly.  He notes his medication is effective in managing his psychiatric conditions.  Today patient was unable to logon virtually so his assessment was done over the phone.  During exam he was pleasant, cooperative, and engaged in conversation.  He informed provider that he has been doing well.  He notes that he enjoyed a Fourth of July holiday with his family.  Patient notes that he has very minimal anxiety and depression.  Provider conducted a GAD-7 and patient scored a 1.  Provider also conducted a PHQ-9 and patient scored a 0.  At times patient notes that he becomes irritable however denies other symptoms of mania and notes that overall his mood is stable.  He endorses adequate sleep and appetite.  Today he denies SI/HI/VAH or paranoia.  No medication changes made today.  Patient agreeable to continue medication as prescribed.  He will follow-up with nursing in 1 month to receive this injection.  No other concerns noted at this time. Visit  Diagnosis:    ICD-10-CM   1. Schizoaffective disorder, bipolar type (HCC)  F25.0 ARIPiprazole ER (ABILIFY MAINTENA) 400 MG PRSY prefilled syringe      Past Psychiatric History: Schizoaffective disorder bipolar type  Past Medical History: History reviewed. No pertinent past medical history. History reviewed. No pertinent surgical history.  Family Psychiatric History:  denied  Family History: History reviewed. No pertinent family history.  Social History:  Social History   Socioeconomic History   Marital status: Single    Spouse name: Not on file   Number of children: Not on file   Years of education: Not on file   Highest education level: Not on file  Occupational History   Not on file  Tobacco Use   Smoking status: Never   Smokeless tobacco: Never  Substance and Sexual Activity   Alcohol use: No   Drug use: Never   Sexual activity: Not Currently  Other Topics Concern   Not on file  Social History Narrative   Not on file   Social Determinants of Health   Financial Resource Strain: Not on file  Food Insecurity: Not on file  Transportation Needs: Not on file  Physical Activity: Not on file  Stress: Not on file  Social Connections: Not on file    Allergies: Not on File  Metabolic Disorder Labs: No results found for: HGBA1C, MPG No results found for: PROLACTIN No results found for: CHOL, TRIG, HDL, CHOLHDL, VLDL, LDLCALC No results found for: TSH  Therapeutic Level Labs: No results found for: LITHIUM No results found for: VALPROATE No components  found for:  CBMZ  Current Medications: Current Outpatient Medications  Medication Sig Dispense Refill   ARIPiprazole ER (ABILIFY MAINTENA) 400 MG PRSY prefilled syringe Inject 400 mg into the muscle every 30 (thirty) days. 1 each 11   Current Facility-Administered Medications  Medication Dose Route Frequency Provider Last Rate Last Admin   ARIPiprazole ER (ABILIFY MAINTENA) 400 MG prefilled syringe 400 mg  400 mg  Intramuscular Q30 days Zena Amos, MD   400 mg at 11/09/20 4076     Musculoskeletal: Strength & Muscle Tone:  Unable to assess due to telephone visit Gait & Station:   Unable to assess due to telephone visit Patient leans: N/A  Psychiatric Specialty Exam: Review of Systems  There were no vitals taken for this visit.There is no height or weight on file to calculate BMI.  General Appearance:   Unable to assess due to telephone visit  Eye Contact:    Unable to assess due to telephone visit  Speech:  Clear and Coherent and Normal Rate  Volume:  Normal  Mood:  Euthymic  Affect:  Appropriate and Congruent  Thought Process:  Coherent, Goal Directed, and Linear  Orientation:  Full (Time, Place, and Person)  Thought Content: WDL and Logical   Suicidal Thoughts:  No  Homicidal Thoughts:  No  Memory:  Immediate;   Good Recent;   Good Remote;   Good  Judgement:  Good  Insight:  Good  Psychomotor Activity:  Normal  Concentration:  Concentration: Good and Attention Span: Good  Recall:  Good  Fund of Knowledge: Good  Language: Good  Akathisia:    Unable to assess due to telephone visit  Handed:  Right  AIMS (if indicated): not done  Assets:  Communication Skills Desire for Improvement Financial Resources/Insurance Social Support  ADL's:  Intact  Cognition: WNL  Sleep:  Good   Screenings: GAD-7    Flowsheet Row [facility] Visit [facility] 11/09/2020 in [facility]  Total GAD-7 Score 1      PHQ2-9    Flowsheet Row Video Visit [facility] 11/09/2020 in [facility]  PHQ-2 Total Score 0  PHQ-9 Total Score 0        Assessment and Plan: Patient notes that he is doing well on his current medication regimen.  No medication changes made today.  Patient agreeable to continue medication as prescribed.  1. Schizoaffective disorder, bipolar type (HCC)  Continue- ARIPiprazole ER (ABILIFY MAINTENA) 400 MG PRSY prefilled syringe; Inject 400 mg  into the muscle every 30 (thirty) days.  Dispense: 1 each; Refill: 11    Shanna Cisco, NP 11/09/2020, 11:01 AM

## 2020-12-07 ENCOUNTER — Encounter (HOSPITAL_COMMUNITY): Payer: Self-pay

## 2020-12-07 ENCOUNTER — Ambulatory Visit (HOSPITAL_COMMUNITY): Payer: Medicaid Other | Admitting: *Deleted

## 2020-12-07 ENCOUNTER — Other Ambulatory Visit: Payer: Self-pay

## 2020-12-07 VITALS — BP 129/79 | HR 69 | Ht 70.0 in | Wt 153.0 lb

## 2020-12-07 DIAGNOSIS — F250 Schizoaffective disorder, bipolar type: Secondary | ICD-10-CM

## 2020-12-07 MED ORDER — ARIPIPRAZOLE ER 400 MG IM PRSY
400.0000 mg | PREFILLED_SYRINGE | INTRAMUSCULAR | Status: DC
  Administered 2020-12-07 – 2021-10-06 (×9): 400 mg via INTRAMUSCULAR

## 2020-12-07 NOTE — Progress Notes (Signed)
In as planned for an injection of Abilify M 400 mg, given today in his R DELTOID without difficulty. Discussed today with him having MCD he can use MCD transport to and from his drs appts rather than [place] continuing to give him bus tickets to return home. He was told today I would give him a final bus ticket and in the future he is to arrange his own transport. He has a good personal appearance today, says he will be returning to the gym with his brother and gave him a lot of encouragement for doing this. He continues to work but not working today. He is to return in one month for his next injection.

## 2021-01-04 ENCOUNTER — Ambulatory Visit (HOSPITAL_COMMUNITY): Payer: Medicaid Other

## 2021-01-11 ENCOUNTER — Ambulatory Visit (HOSPITAL_COMMUNITY): Payer: Medicaid Other | Admitting: *Deleted

## 2021-01-11 ENCOUNTER — Other Ambulatory Visit: Payer: Self-pay

## 2021-01-11 ENCOUNTER — Encounter (HOSPITAL_COMMUNITY): Payer: Self-pay

## 2021-01-11 VITALS — BP 124/78 | HR 70 | Ht 70.0 in | Wt 160.0 lb

## 2021-01-11 DIAGNOSIS — F250 Schizoaffective disorder, bipolar type: Secondary | ICD-10-CM

## 2021-01-11 NOTE — Progress Notes (Signed)
In for his monthly injection of Abilify M 400 mg which he brings himself as he has MCD. Today he got his injection in his L DELTOID without difficulty. He states his whole family got together for the Labor Day Holiday and went out to dinner. He enjoyed it, he gets along well with his family. He offers no complaints. Discussed with him a referral to Vocational Rehab, and to let me know if he ever feels he wants to work and needs some assitance finding the right job for him. He listened, expressed his understanding, but not interested at this time, states he is considering his options. He is to return in one month for his next injection, and will see a provider utilizing the shot clinic and the new NP providers that work within the the shot clinic time slots.

## 2021-02-07 ENCOUNTER — Telehealth (HOSPITAL_COMMUNITY): Payer: Medicaid Other | Admitting: Psychiatry

## 2021-02-08 ENCOUNTER — Encounter (HOSPITAL_COMMUNITY): Payer: Self-pay | Admitting: Family

## 2021-02-08 ENCOUNTER — Other Ambulatory Visit: Payer: Self-pay

## 2021-02-08 ENCOUNTER — Ambulatory Visit (INDEPENDENT_AMBULATORY_CARE_PROVIDER_SITE_OTHER): Payer: Medicaid Other | Admitting: Family

## 2021-02-08 ENCOUNTER — Ambulatory Visit (HOSPITAL_COMMUNITY): Payer: Medicaid Other

## 2021-02-08 VITALS — BP 140/84 | HR 91 | Ht 70.0 in | Wt 157.0 lb

## 2021-02-08 DIAGNOSIS — F250 Schizoaffective disorder, bipolar type: Secondary | ICD-10-CM | POA: Diagnosis not present

## 2021-02-08 NOTE — Progress Notes (Signed)
In as scheduled for his monthly injection of Abilify M 400 mg given today in his R deltoid without difficulty. He is also here today to see the provider for his every three month assessment. He had a birthday yesterday and told writer about it, out to dinner with his mom and has new clothes on he bought with his birthday money. He is in good spirits and offers no complaints. Asked him if he would like me to refer him to [facility], but he said he would like to wait a while longer before getting a job. He is to return in one month for his next injection.

## 2021-02-08 NOTE — Progress Notes (Signed)
BH MD/PA/NP OP Progress Note  02/08/2021 9:25 AM Hector Wright  MRN:  [id number]  Chief Complaint:  Chief Complaint   Injections    HPI: medication injection, outpatient follow-up visit.  Visit Diagnosis:    ICD-10-CM   1. Schizoaffective disorder, bipolar type (HCC)  F25.0       Past Psychiatric History: schizoaffective disorder, bipolar type  Past Medical History: History reviewed. No pertinent past medical history. History reviewed. No pertinent surgical history.  Family Psychiatric History: none reported  Family History: History reviewed. No pertinent family history.  Social History:  Social History   Socioeconomic History   Marital status: Single    Spouse name: Not on file   Number of children: Not on file   Years of education: Not on file   Highest education level: Not on file  Occupational History   Not on file  Tobacco Use   Smoking status: Never   Smokeless tobacco: Never  Substance and Sexual Activity   Alcohol use: No   Drug use: Never   Sexual activity: Not Currently  Other Topics Concern   Not on file  Social History Narrative   Not on file   Social Determinants of Health   Financial Resource Strain: Not on file  Food Insecurity: Not on file  Transportation Needs: Not on file  Physical Activity: Not on file  Stress: Not on file  Social Connections: Not on file    Allergies: Not on File  Metabolic Disorder Labs: No results found for: HGBA1C, MPG No results found for: PROLACTIN No results found for: CHOL, TRIG, HDL, CHOLHDL, VLDL, LDLCALC No results found for: TSH  Therapeutic Level Labs: No results found for: LITHIUM No results found for: VALPROATE No components found for:  CBMZ  Current Medications: Current Outpatient Medications  Medication Sig Dispense Refill   ARIPiprazole ER (ABILIFY MAINTENA) 400 MG PRSY prefilled syringe Inject 400 mg into the muscle every 30 (thirty) days. 1 each 11   Current Facility-Administered  Medications  Medication Dose Route Frequency Provider Last Rate Last Admin   ARIPiprazole ER (ABILIFY MAINTENA) 400 MG prefilled syringe 400 mg  400 mg Intramuscular Q28 days Toy Cookey E, NP   400 mg at 02/08/21 0919     Musculoskeletal: Strength & Muscle Tone: within normal limits Gait & Station: normal Patient leans: N/A  Psychiatric Specialty Exam: Review of Systems  Constitutional: Negative.   HENT: Negative.    Eyes: Negative.   Respiratory: Negative.    Cardiovascular: Negative.   Gastrointestinal: Negative.   Genitourinary: Negative.   Musculoskeletal: Negative.   Skin: Negative.   Neurological: Negative.   Hematological: Negative.   Psychiatric/Behavioral: Negative.     Blood pressure 140/84, pulse 91, height 5\' 10"  (1.778 m), weight 157 lb (71.2 kg), SpO2 100 %.Body mass index is 22.53 kg/m.  General Appearance: Casual  Eye Contact:  Good  Speech:  Clear and Coherent and Normal Rate  Volume:  Normal  Mood:  Euthymic  Affect:  Appropriate and Congruent  Thought Process:  Coherent, Goal Directed, and Linear  Orientation:  Full (Time, Place, and Person)  Thought Content: WDL and Logical   Suicidal Thoughts:  No  Homicidal Thoughts:  No  Memory:  Immediate;   Good Recent;   Good Remote;   Good  Judgement:  Good  Insight:  Good  Psychomotor Activity:  Normal  Concentration:  Concentration: Good and Attention Span: Good  Recall:  Good  Fund of Knowledge: Good  Language: Good  Akathisia:  No  Handed:  Right  AIMS (if indicated): done  Assets:  Communication Skills Desire for Improvement Financial Resources/Insurance Housing Intimacy Leisure Time Physical Health  ADL's:  Intact  Cognition: WNL  Sleep:  Good   Screenings: GAD-7    Flowsheet Row Video Visit from 11/09/2020 in [facility]  Total GAD-7 Score 1      PHQ2-9    Flowsheet Row Clinical Support from 02/08/2021 in [facility]  Video Visit from 11/09/2020 in [facility]  PHQ-2 Total Score 0 0  PHQ-9 Total Score -- 0      Flowsheet Row Clinical Support from 02/08/2021 in [facility]  C-SSRS RISK CATEGORY No Risk        Assessment and Plan:  Hector Wright is a 35 year old male seen today for follow-up psychiatric evaluation. Psychiatric history includes schizoaffective disorder,  bipolar type.  He has been managed historically on Abilify Maintena 400 mg IM. He reports feeling that his mood is managed effectively by [facility]. Hector Wright is uncertain regarding current outpatient counseling, states he has been followed by outpatient counseling in the past.  Hector Wright reports he celebrated his birthday with family and friends on yesterday. Enjoyed this celebration as he received gifts and money from family members. He enjoys reading, video games and enjoying time outdoors during his free time.   Patient is assessed face-to-face by nurse practitioner, chart reviewed.    Today patient is seated, no acute distress. He is appropriately groomed. He is alert and oriented, pleasant and cooperative. He has clear and coherent speech average volume.  Behavior calm and appropriate, with good eye contact. He reports euthymic mood, congruent affect.    Today provider conducted a GAD-7, patient scored a 0, at last visit he scored a 1. Provider also conducted a PHQ-2, he scored a 0, at last visit he scored a 0 as well. He denies suicidal and homicidal ideations currently.  He contracts verbally for safety with this Clinical research associate.    Hector Wright denies suicidal and homicidal ideations, no self-harm reported.  He denies both auditory and visual hallucinations.  There is no indication that he is responding to internal stimuli, no evidence of delusional thought content.  Patient is able to converse coherently with goal-directed thoughts and no distractibility or preoccupation. He denies paranoia.  Objectively there is  no evidence of psychosis/mania or delusional thinking. Patient is insightful regarding treatment and diagnosis.   Patient is tolerating medications with no adverse effects/reactions per his report.  Patient reports average sleep and appetite.   He denies physical pain currently. He denies both alcohol and substance use. He resides in [place] with his mother and younger brother, denies access to weapons. He is currently not employed.    Patient provided support and encouragement.  Patient to receive monthly [facility] Abilify Maintena 400mg  monthly. Plan to follow-up in one month for Abilify Maintena monthly injection. Laboratory studies ordered to be collected at next visit, approx. one month including CBC, CMP, TSH, A1C, lipid panel, prolactin   Patient reviewed with Dr .

## 2021-03-08 ENCOUNTER — Ambulatory Visit (HOSPITAL_COMMUNITY): Payer: Medicaid Other

## 2021-03-10 ENCOUNTER — Ambulatory Visit (HOSPITAL_COMMUNITY): Payer: Medicaid Other | Admitting: *Deleted

## 2021-03-10 ENCOUNTER — Encounter (HOSPITAL_COMMUNITY): Payer: Self-pay

## 2021-03-10 ENCOUNTER — Other Ambulatory Visit: Payer: Self-pay

## 2021-03-10 VITALS — BP 136/81 | HR 83 | Ht 70.0 in | Wt 156.0 lb

## 2021-03-10 DIAGNOSIS — F250 Schizoaffective disorder, bipolar type: Secondary | ICD-10-CM

## 2021-03-10 NOTE — Progress Notes (Signed)
In as scheduled for his monthly injection of Abilify M 400 mg, given today in his L DELTOID without issues. He denies any sx of his mental illness. He states he is hanging out with his friends playing video games. He is not working and states he is not ready yet for me to refer him to [facility]. He is pleasant and appropriate and a nice appearance. He is due back for his next injection in one month which will be after Thanksgiving which he is celebrating with his mom and uncle. No complaints voiced, he is at baseline.

## 2021-04-07 ENCOUNTER — Ambulatory Visit (HOSPITAL_COMMUNITY): Payer: Medicaid Other

## 2021-04-12 ENCOUNTER — Other Ambulatory Visit: Payer: Self-pay

## 2021-04-12 ENCOUNTER — Encounter (HOSPITAL_COMMUNITY): Payer: Self-pay

## 2021-04-12 ENCOUNTER — Ambulatory Visit (INDEPENDENT_AMBULATORY_CARE_PROVIDER_SITE_OTHER): Payer: Medicaid Other | Admitting: *Deleted

## 2021-04-12 VITALS — BP 135/83 | HR 64 | Ht 70.0 in | Wt 162.0 lb

## 2021-04-12 DIAGNOSIS — F250 Schizoaffective disorder, bipolar type: Secondary | ICD-10-CM

## 2021-04-12 NOTE — Progress Notes (Signed)
Patient arrived for injection ARIPiprazole ER (ABILIFY MAINTENA) 400 MG prefilled syringe Tolerated injection well in Right-Arm.  NO HI/SI NOR AH/VH

## 2021-05-17 ENCOUNTER — Other Ambulatory Visit: Payer: Self-pay

## 2021-05-17 ENCOUNTER — Ambulatory Visit (INDEPENDENT_AMBULATORY_CARE_PROVIDER_SITE_OTHER): Payer: Medicaid Other | Admitting: Family

## 2021-05-17 ENCOUNTER — Encounter (HOSPITAL_COMMUNITY): Payer: Self-pay

## 2021-05-17 ENCOUNTER — Encounter (HOSPITAL_COMMUNITY): Payer: Self-pay | Admitting: Family

## 2021-05-17 ENCOUNTER — Ambulatory Visit (INDEPENDENT_AMBULATORY_CARE_PROVIDER_SITE_OTHER): Payer: Medicaid Other | Admitting: *Deleted

## 2021-05-17 VITALS — BP 130/80 | HR 75 | Ht 70.0 in | Wt 156.0 lb

## 2021-05-17 DIAGNOSIS — F250 Schizoaffective disorder, bipolar type: Secondary | ICD-10-CM

## 2021-05-17 MED ORDER — ARIPIPRAZOLE ER 400 MG IM PRSY
400.0000 mg | PREFILLED_SYRINGE | Freq: Once | INTRAMUSCULAR | Status: AC
  Administered 2021-11-10: 400 mg via INTRAMUSCULAR

## 2021-05-17 NOTE — Progress Notes (Signed)
BH MD/PA/NP OP Progress Note  05/17/2021 9:34 AM Hector Wright  MRN:  [id number]  Chief Complaint:  HPI:  Hector Wright is a 36 year old male seen today for follow-up psychiatric evaluation. Psychiatric history includes schizoaffective disorder, bipolar disorder,.   He has been managed historically on  Abilify Maintena 400 mg IM.Marland Kitchen He reports feeling that his mood is managed effectively by LAI.   Patient is assessed face-to-face by nurse practitioner, chart reviewed.    Today patient is seated, no acute distress. He is appropriately groomed. He is alert and oriented, pleasant and cooperative. He has clear and coherent speech average volume.  Behavior calm and appropriate, with good eye contact. Mood today appears euthymic, congruent affect.    Today provider conducted a PHQ-2, he scored 0. At last visit he also  scored a 0.   No evidence of delusional thought content.  Patient is able to converse coherently with goal-directed thoughts and no distractibility or preoccupation. He denies paranoia.  Objectively there is no evidence of psychosis/mania or delusional thinking. Patient is insightful regarding treatment and diagnosis.   Patient is tolerating medications with no adverse effects/reactions per his report.  Patient reports average sleep,reports he is attempting to extend sleep by discounting television viewing at a more reasonable hour.  He denies physical pain currently.  He denies both alcohol and substance use in the last two months. He shares positive recent outcomes include watching football on television and cheering on his favorite team. He also enjoys playing video games and preparing meals for his family.   Patient resides in [place] with his mother and two brothers. He endorses good appetite.     Patient provided support and encouragement.  Patient to receive monthly LAI Abilify Maintena 400mg  monthly. Plan to follow-up in one month for Abilify Maintena monthly  injection.      Visit Diagnosis: No diagnosis found.  Past Psychiatric History: schizoaffective disorder, bipolar type  Past Medical History: No past medical history on file. No past surgical history on file.  Family Psychiatric History: none reported  Family History: No family history on file.  Social History:  Social History   Socioeconomic History   Marital status: Single    Spouse name: Not on file   Number of children: Not on file   Years of education: Not on file   Highest education level: Not on file  Occupational History   Not on file  Tobacco Use   Smoking status: Never   Smokeless tobacco: Never  Substance and Sexual Activity   Alcohol use: No   Drug use: Never   Sexual activity: Not Currently  Other Topics Concern   Not on file  Social History Narrative   Not on file   Social Determinants of Health   Financial Resource Strain: Not on file  Food Insecurity: Not on file  Transportation Needs: Not on file  Physical Activity: Not on file  Stress: Not on file  Social Connections: Not on file    Allergies: Not on File  Metabolic Disorder Labs: No results found for: HGBA1C, MPG No results found for: PROLACTIN No results found for: CHOL, TRIG, HDL, CHOLHDL, VLDL, LDLCALC No results found for: TSH  Therapeutic Level Labs: No results found for: LITHIUM No results found for: VALPROATE No components found for:  CBMZ  Current Medications: Current Outpatient Medications  Medication Sig Dispense Refill   ARIPiprazole ER (ABILIFY MAINTENA) 400 MG PRSY prefilled syringe Inject 400 mg into the muscle every 30 (thirty) days. 1  each 11   Current Facility-Administered Medications  Medication Dose Route Frequency Provider Last Rate Last Admin   ARIPiprazole ER (ABILIFY MAINTENA) 400 MG prefilled syringe 400 mg  400 mg Intramuscular Q28 days Eulis Canner E, NP   400 mg at 04/12/21 0830     Musculoskeletal: Strength & Muscle Tone: within normal  limits Gait & Station: normal Patient leans: N/A  Psychiatric Specialty Exam: Review of Systems  All other systems reviewed and are negative.  There were no vitals taken for this visit.There is no height or weight on file to calculate BMI.  General Appearance: Casual and Fairly Groomed  Eye Contact:  Good  Speech:  Clear and Coherent and Normal Rate  Volume:  Normal  Mood:  Euthymic  Affect:  Appropriate and Congruent  Thought Process:  Coherent, Goal Directed, and Linear  Orientation:  Full (Time, Place, and Person)  Thought Content: WDL and Logical   Suicidal Thoughts:  No  Homicidal Thoughts:  No  Memory:  Immediate;   Good Recent;   Good  Judgement:  Good  Insight:  Good  Psychomotor Activity:  Normal  Concentration:  Concentration: Good and Attention Span: Good  Recall:  Good  Fund of Knowledge: Good  Language: Good  Akathisia:  No  Handed:  Right  AIMS (if indicated): done  Assets:  Communication Skills Desire for Improvement Financial Resources/Insurance Housing Intimacy Leisure Time Physical Health Resilience Social Support  ADL's:  Intact  Cognition: WNL  Sleep:  Fair   Screenings: AIMS    Flowsheet Row Clinical Support from 02/08/2021 in [place] Total Score 0      GAD-7    [place] from 02/08/2021 in [facility] Video Visit from 11/09/2020 in [facility]  Total GAD-7 Score 0 1      PHQ2-9    [place] Office Visit from 05/17/2021 in [place] from 02/08/2021 in [facility] Video Visit from 11/09/2020 in [facility]  PHQ-2 Total Score 0 0 0  PHQ-9 Total Score -- -- 0      Flowsheet Row Office Visit from 05/17/2021 in [place] from 02/08/2021 in [place] No Risk No Risk        Assessment and Plan: Patient reviewed with Dr Hampton Abbot. Patient to receive monthly LAI Abilify Maintena 400mg  monthly. Plan to follow-up in one month for Abilify Maintena monthly injection.   Lucky Rathke, FNP 05/17/2021, 9:34 AM

## 2021-05-17 NOTE — Progress Notes (Signed)
Patient arrived for his injection ARIPiprazole ER (ABILIFY MAINTENA) 400 MG . Tolerated well in Left-Arm. Pleasant as Always. Joking about our 2 Footballs teams playing against each other on Sunday. And that his Team was the Winner(lol).  NO HI/S/   NOR AH/VH

## 2021-06-14 ENCOUNTER — Other Ambulatory Visit: Payer: Self-pay

## 2021-06-14 ENCOUNTER — Encounter (HOSPITAL_COMMUNITY): Payer: Self-pay

## 2021-06-14 ENCOUNTER — Ambulatory Visit (HOSPITAL_COMMUNITY): Payer: Medicaid Other

## 2021-06-14 ENCOUNTER — Ambulatory Visit (HOSPITAL_COMMUNITY): Payer: Medicaid Other | Admitting: *Deleted

## 2021-06-14 VITALS — BP 135/91 | HR 80 | Ht 70.0 in | Wt 156.0 lb

## 2021-06-14 DIAGNOSIS — F250 Schizoaffective disorder, bipolar type: Secondary | ICD-10-CM

## 2021-06-14 NOTE — Progress Notes (Signed)
In as scheduled for his monthly injection of Abilify M 400 mg, given today in his R DELTOID. He is in good spirits, looking forward to the Superbowl on Sunday, he team is in the game. He says things are the same and he is at his baseline. No complaints offered. To return in one month.

## 2021-07-12 ENCOUNTER — Other Ambulatory Visit: Payer: Self-pay

## 2021-07-12 ENCOUNTER — Ambulatory Visit (HOSPITAL_COMMUNITY): Payer: Medicaid Other

## 2021-07-12 ENCOUNTER — Encounter (HOSPITAL_COMMUNITY): Payer: Self-pay

## 2021-07-12 ENCOUNTER — Ambulatory Visit (INDEPENDENT_AMBULATORY_CARE_PROVIDER_SITE_OTHER): Payer: Medicaid Other | Admitting: *Deleted

## 2021-07-12 VITALS — BP 128/69 | HR 68 | Ht 70.0 in | Wt 156.0 lb

## 2021-07-12 DIAGNOSIS — F250 Schizoaffective disorder, bipolar type: Secondary | ICD-10-CM | POA: Diagnosis not present

## 2021-07-12 NOTE — Progress Notes (Signed)
PATIENT ARRIVED FOR ARIPiprazole ER (ABILIFY MAINTENA) 400 MG  ?PATIENT DOING WELL  & TOLERATED INJECTION WELL IN LEFT ARM. PLEASANT AS ALWAYS ?

## 2021-08-09 ENCOUNTER — Ambulatory Visit (INDEPENDENT_AMBULATORY_CARE_PROVIDER_SITE_OTHER): Payer: Medicaid Other | Admitting: *Deleted

## 2021-08-09 ENCOUNTER — Encounter (HOSPITAL_COMMUNITY): Payer: Self-pay

## 2021-08-09 VITALS — BP 121/82 | HR 80 | Ht 70.0 in | Wt 156.0 lb

## 2021-08-09 DIAGNOSIS — F250 Schizoaffective disorder, bipolar type: Secondary | ICD-10-CM | POA: Diagnosis not present

## 2021-08-09 NOTE — Progress Notes (Signed)
Patient arrived for  ARIPiprazole ER (ABILIFY MAINTENA) 400 MG Injection ?Tolerated well in Right-Arm & pleasant as Always ?

## 2021-09-06 ENCOUNTER — Encounter (HOSPITAL_COMMUNITY): Payer: Self-pay

## 2021-09-06 ENCOUNTER — Ambulatory Visit (INDEPENDENT_AMBULATORY_CARE_PROVIDER_SITE_OTHER): Payer: Medicaid Other | Admitting: *Deleted

## 2021-09-06 VITALS — BP 121/76 | HR 65 | Ht 70.0 in | Wt 151.0 lb

## 2021-09-06 DIAGNOSIS — F250 Schizoaffective disorder, bipolar type: Secondary | ICD-10-CM | POA: Diagnosis not present

## 2021-09-06 NOTE — Progress Notes (Signed)
Patient arrived for his ARIPiprazole ER (ABILIFY MAINTENA) 400 MG  Injection. ?Tolerated injection well in Left-Arm. Pleasant as always. Patient shared that he's into exercising more & lifting weights to stay healthy. ?He's looking extremely fit, encouraged him to continue as long as it's making him happy. ?

## 2021-10-06 ENCOUNTER — Ambulatory Visit (INDEPENDENT_AMBULATORY_CARE_PROVIDER_SITE_OTHER): Payer: Medicaid Other | Admitting: Registered Nurse

## 2021-10-06 ENCOUNTER — Encounter (HOSPITAL_COMMUNITY): Payer: Self-pay

## 2021-10-06 ENCOUNTER — Ambulatory Visit (HOSPITAL_COMMUNITY): Payer: Medicaid Other

## 2021-10-06 ENCOUNTER — Ambulatory Visit (INDEPENDENT_AMBULATORY_CARE_PROVIDER_SITE_OTHER): Payer: Medicaid Other | Admitting: *Deleted

## 2021-10-06 ENCOUNTER — Encounter (HOSPITAL_COMMUNITY): Payer: Self-pay | Admitting: Registered Nurse

## 2021-10-06 VITALS — BP 133/75 | HR 88 | Ht 70.0 in | Wt 150.0 lb

## 2021-10-06 DIAGNOSIS — F250 Schizoaffective disorder, bipolar type: Secondary | ICD-10-CM

## 2021-10-06 MED ORDER — ARIPIPRAZOLE ER 400 MG IM PRSY: 400.0000 mg | PREFILLED_SYRINGE | INTRAMUSCULAR | 6 refills | Status: DC

## 2021-10-06 NOTE — Progress Notes (Signed)
In as scheduled for his monthly injection. Today he got Abilify M 400 mg in his R DELTOID without issue. He offers no complaints and says nothing has changed. He was also seen by [facility] NP for his three month assessment. He is to return in one month for his next shot.

## 2021-10-06 NOTE — Progress Notes (Signed)
BH MD/PA/NP OP Progress Note  10/06/2021 9:00 AM STANFORD STRAUCH  MRN:  [id number]  Chief Complaint:  Chief Complaint  Patient presents with   Follow up psychiatric evaluation and administration of long   HPI: Hector Wright 36 y.o. male seen face to face in office today for follow-up psychiatric evaluation and administration of long acting injectable (Abilify Maintena).  His psychiatric history includes Schizoaffective disorder bipolar type, .  His mental health is managed with Abilify Maintena 400 mg every 28 days.  He reports Abilify Roderic Palau continues to manage mental health well without adverse reaction.    He denies depression, anxiety, fluctuations in mood, abnormal movement, suicidal/self-harm/homicidal ideation, psychosis, and paranoia.  AIMS, PHQ 2 & 9 with C-SSRS, and GAD 7 screenings conducted by provider see scores below.  He reports he is eating and sleeping without difficulty.  He denies use of illicit drugs. Reports everything has bee going fine for him.  States he is disability but doesn't allow himself to get board "If I feel board I go for a walk or work out."   Visit Diagnosis:    ICD-10-CM   1. Schizoaffective disorder, bipolar type (HCC)  F25.0 ARIPiprazole ER (ABILIFY MAINTENA) 400 MG PRSY prefilled syringe      Past Psychiatric History: Schizoaffective disorder bipolar type  Past Medical History: History reviewed. No pertinent past medical history. History reviewed. No pertinent surgical history.  Family Psychiatric History: None reported  Family History: History reviewed. No pertinent family history.  Social History:  Social History   Socioeconomic History   Marital status: Single    Spouse name: Not on file   Number of children: Not on file   Years of education: Not on file   Highest education level: Not on file  Occupational History   Not on file  Tobacco Use   Smoking status: Never   Smokeless tobacco: Never  Substance and Sexual Activity   Alcohol use:  No   Drug use: Never   Sexual activity: Not Currently  Other Topics Concern   Not on file  Social History Narrative   Not on file   Social Determinants of Health   Financial Resource Strain: Not on file  Food Insecurity: Not on file  Transportation Needs: Not on file  Physical Activity: Not on file  Stress: Not on file  Social Connections: Not on file    Allergies: Not on File  Metabolic Disorder Labs: No results found for: HGBA1C, MPG No results found for: PROLACTIN No results found for: CHOL, TRIG, HDL, CHOLHDL, VLDL, LDLCALC No results found for: TSH  Therapeutic Level Labs: No results found for: LITHIUM No results found for: VALPROATE No components found for:  CBMZ  Current Medications: Current Outpatient Medications  Medication Sig Dispense Refill   ARIPiprazole ER (ABILIFY MAINTENA) 400 MG PRSY prefilled syringe Inject 400 mg into the muscle every 30 (thirty) days. 1 each 6   Current Facility-Administered Medications  Medication Dose Route Frequency Provider Last Rate Last Admin   ARIPiprazole ER (ABILIFY MAINTENA) 400 MG prefilled syringe 400 mg  400 mg Intramuscular Once Lenard Lance, FNP         Musculoskeletal: Strength & Muscle Tone: within normal limits Gait & Station: normal Patient leans: N/A  Psychiatric Specialty Exam: Review of Systems  Constitutional: Negative.   HENT: Negative.    Eyes: Negative.   Respiratory: Negative.    Cardiovascular: Negative.   Gastrointestinal: Negative.   Genitourinary: Negative.   Musculoskeletal: Negative.  Skin: Negative.   Neurological: Negative.   Hematological: Negative.   Psychiatric/Behavioral:  Negative for agitation, dysphoric mood, hallucinations, self-injury, sleep disturbance and suicidal ideas. The patient is not nervous/anxious and is not hyperactive.    There were no vitals taken for this visit.There is no height or weight on file to calculate BMI.  General Appearance: Casual and Neat  Eye  Contact:  Good  Speech:  Clear and Coherent and Normal Rate  Volume:  Normal  Mood:  Euthymic  Affect:  Appropriate and Congruent  Thought Process:  Coherent, Goal Directed, and Descriptions of Associations: Intact  Orientation:  Full (Time, Place, and Person)  Thought Content: WDL and Logical   Suicidal Thoughts:  No  Homicidal Thoughts:  No  Memory:  Immediate;   Good Recent;   Good Remote;   Good  Judgement:  Intact  Insight:  Present  Psychomotor Activity:  Normal  Concentration:  Concentration: Good and Attention Span: Good  Recall:  Good  Fund of Knowledge: Good  Language: Good  Akathisia:  No  Handed:  Right  AIMS (if indicated): done  Assets:  Communication Skills Desire for Improvement Financial Resources/Insurance Housing Leisure Time Physical Health Resilience Social Support  ADL's:  Intact  Cognition: WNL  Sleep:  Good   Screenings: AIMS    Flowsheet Row Office Visit from 10/06/2021 in [facility] Clinical Support from 02/08/2021 in [facility]  AIMS Total Score 0 0      GAD-7    Flowsheet Row Office Visit from 10/06/2021 in [facility] Clinical Support from 02/08/2021 in [facility] Video Visit from 11/09/2020 in [facility]  Total GAD-7 Score 0 0 1      PHQ2-9    Flowsheet Row Office Visit from 10/06/2021 in [facility] Office Visit from 05/17/2021 in [facility] Clinical Support from 02/08/2021 in [facility] Video Visit from 11/09/2020 in [facility]  PHQ-2 Total Score 0 0 0 0  PHQ-9 Total Score -- -- -- 0      Flowsheet Row Office Visit from 10/06/2021 in [facility] Office Visit from 05/17/2021 in [facility] Clinical Support from 02/08/2021 in  [facility]  C-SSRS RISK CATEGORY No Risk No Risk No Risk        Assessment and Plan: Hector Wright appears to be doing well.  He reports that medication is effective and managing his mental health without adverse reaction.  During visit he is dressed appropriate for weather.  He is sitting upright in chair with no noted distress.  He is alert/oriented x 4, calm/cooperative and mood is congruent with affect.  He spoke in a clear tone at moderate volume, and normal pace, with good eye contact.  His thought process is coherent and relevant; and there is no indication that he is currently responding to internal/external stimuli or experiencing delusional thought content.  He denies depression, anxiety, suicidal/self-harm/homicidal ideation, psychosis, paranoia, and fluctuations in mood.    1. Schizoaffective disorder, bipolar type (HCC) - ARIPiprazole ER (ABILIFY MAINTENA) 400 MG PRSY prefilled syringe; Inject 400 mg into the muscle every 30 (thirty) days.  Dispense: 1 each; Refill: 6    Collaboration of Care: Collaboration of Care: Medication Management AEB Injection of Abilify Maintena and refill of medication Meds ordered this encounter  Medications  ARIPiprazole ER (ABILIFY MAINTENA) 400 MG PRSY prefilled syringe    Sig: Inject 400 mg into the muscle every 30 (thirty) days.    Dispense:  1 each    Refill:  6    Order Specific Question:   Supervising Provider    Answer:   Nelly RoutKUMAR, ARCHANA [[id number]]     Patient/Guardian was advised Release of Information must be obtained prior to any record release in order to collaborate their care with an outside provider. Patient/Guardian was advised if they have not already done so to contact the registration department to sign all necessary forms in order for us to release information regarding their care.   Consent: Patient/Guardian gives verbal consent for treatment and assignment of benefits for services provided during this  visit. Patient/Guardian expressed understanding and agreed to proceed.    Kaley Jutras, NP 10/06/2021, 9:00 AM

## 2021-11-10 ENCOUNTER — Ambulatory Visit (INDEPENDENT_AMBULATORY_CARE_PROVIDER_SITE_OTHER): Payer: Medicaid Other | Admitting: *Deleted

## 2021-11-10 ENCOUNTER — Encounter (HOSPITAL_COMMUNITY): Payer: Self-pay

## 2021-11-10 VITALS — BP 126/73 | HR 59 | Ht 70.0 in | Wt 155.0 lb

## 2021-11-10 DIAGNOSIS — F250 Schizoaffective disorder, bipolar type: Secondary | ICD-10-CM | POA: Diagnosis not present

## 2021-11-10 NOTE — Progress Notes (Signed)
Patient arrived for his injection: ARIPiprazole ER (ABILIFY MAINTENA) 400 MG   Pleasant as Always. Tolerated injection well in Left-Arm.  NO HI/SI NOR AH/VH

## 2021-12-08 ENCOUNTER — Ambulatory Visit (INDEPENDENT_AMBULATORY_CARE_PROVIDER_SITE_OTHER): Payer: Medicaid Other | Admitting: *Deleted

## 2021-12-08 ENCOUNTER — Encounter (HOSPITAL_COMMUNITY): Payer: Self-pay

## 2021-12-08 VITALS — BP 127/87 | HR 72 | Ht 70.0 in | Wt 148.0 lb

## 2021-12-08 DIAGNOSIS — F250 Schizoaffective disorder, bipolar type: Secondary | ICD-10-CM

## 2021-12-08 MED ORDER — ARIPIPRAZOLE ER 400 MG IM PRSY
400.0000 mg | PREFILLED_SYRINGE | Freq: Once | INTRAMUSCULAR | Status: AC
  Administered 2021-12-08: 400 mg via INTRAMUSCULAR

## 2021-12-08 NOTE — Progress Notes (Signed)
In as scheduled for his monthly injection of Abilify M which he brings to the appt as he has MCD. He offers no complaints or concerns when asked. He reports he has been working out with his time as he is unemployed. He is not interested in working at this time. He has developed his arm muscles and given praise for his hard work. He denies any sx of psychosis. Today he got his shot in his R DELTOID without issue. He is to return in 28 days for his next shot. Discussed with him changing to the 2 month Abilify shot but he is not interested at this time.

## 2022-01-03 ENCOUNTER — Ambulatory Visit (HOSPITAL_COMMUNITY): Payer: Medicaid Other

## 2022-01-10 ENCOUNTER — Ambulatory Visit (HOSPITAL_COMMUNITY): Payer: Medicaid Other

## 2022-01-12 ENCOUNTER — Ambulatory Visit (HOSPITAL_COMMUNITY): Payer: Medicaid Other

## 2022-01-12 ENCOUNTER — Encounter (HOSPITAL_COMMUNITY): Payer: Self-pay

## 2022-01-12 VITALS — BP 135/81 | HR 83 | Ht 70.0 in

## 2022-01-12 DIAGNOSIS — F250 Schizoaffective disorder, bipolar type: Secondary | ICD-10-CM

## 2022-01-12 MED ORDER — ARIPIPRAZOLE ER 400 MG IM PRSY
400.0000 mg | PREFILLED_SYRINGE | Freq: Once | INTRAMUSCULAR | Status: AC
  Administered 2022-01-12: 400 mg via INTRAMUSCULAR

## 2022-01-12 NOTE — Progress Notes (Signed)
Patient arrived for his injection: ARIPiprazole ER (ABILIFY MAINTENA) 400 MG    Pleasant as Always. Tolerated injection well in Left-Arm.   NO HI/SI NOR AH/VH

## 2022-02-09 ENCOUNTER — Ambulatory Visit (INDEPENDENT_AMBULATORY_CARE_PROVIDER_SITE_OTHER): Payer: Medicaid Other | Admitting: *Deleted

## 2022-02-09 ENCOUNTER — Ambulatory Visit (INDEPENDENT_AMBULATORY_CARE_PROVIDER_SITE_OTHER): Payer: Medicaid Other | Admitting: Registered Nurse

## 2022-02-09 ENCOUNTER — Encounter (HOSPITAL_COMMUNITY): Payer: Self-pay | Admitting: Registered Nurse

## 2022-02-09 ENCOUNTER — Encounter (HOSPITAL_COMMUNITY): Payer: Self-pay

## 2022-02-09 VITALS — BP 137/83 | HR 71 | Ht 70.0 in | Wt 150.0 lb

## 2022-02-09 DIAGNOSIS — F250 Schizoaffective disorder, bipolar type: Secondary | ICD-10-CM

## 2022-02-09 MED ORDER — ARIPIPRAZOLE ER 400 MG IM SRER: 400.0000 mg | INTRAMUSCULAR | 3 refills | Status: DC

## 2022-02-09 MED ORDER — ARIPIPRAZOLE ER 400 MG IM PRSY
400.0000 mg | PREFILLED_SYRINGE | Freq: Once | INTRAMUSCULAR | Status: AC
  Administered 2022-02-09: 400 mg via INTRAMUSCULAR

## 2022-02-09 NOTE — Progress Notes (Signed)
Patient arrived for Injection -- ARIPiprazole ER (ABILIFY MAINTENA) 400 MG PRSY prefilled syringe Inject 400 mg into the muscle every 30 (thirty) days  Patient states everything is GOOD-- Pleasant as Always-- tolerated injection well in Right Arm.

## 2022-02-09 NOTE — Progress Notes (Addendum)
[facility] MD/PA/NP OP Progress Note  02/09/2022 9:06 AM Hector Wright  MRN:  [id number]  Chief Complaint:  Chief Complaint  Patient presents with   Follow-up    Psychiatric evaluation and administration of long acting   HPI: Hector Wright 26 yr. male seen face to face in office today for follow-up psychiatric evaluation and administration of long acting injectable (Abilify Maintena).  His psychiatric history includes schizoaffective disorder bipolar type.  His mental health is managed with Abilify Maintena 400 mg every 28 days.  He reports Abilify Jodi Geralds continues to manage mental health well without adverse reaction.  He states he has had no problems since starting Abilify.  He denies depression, anxiety, fluctuations in mood, abnormal movement, suicidal/self-harm/homicidal ideation, psychosis, and paranoia.  AIMS, PHQ 2 & 9 with C-SSRS, and GAD 7 screenings conducted by provider see scores below.  He reports he is eating and sleeping without difficulty.  He denies use of illicit drugs.  Reports he likes to spend his time working out, listening to music, and eating out "hibachi is my favorite."  Visit Diagnosis:    ICD-10-CM   1. Schizoaffective disorder, bipolar type ([facility])  F25.0 TSH    Lipid Profile    CBC with Differential    COMPLETE METABOLIC PANEL WITH GFR    HgB A1c    Prolactin    Magnesium    ARIPiprazole ER (ABILIFY MAINTENA) 400 MG SRER injection      Past Psychiatric History: Schizoaffective disorder bipolar type  Past Medical History: History reviewed. No pertinent past medical history. History reviewed. No pertinent surgical history.  Family Psychiatric History: None reported  Family History: History reviewed. No pertinent family history.  Social History:  Social History   Socioeconomic History   Marital status: Single    Spouse name: Not on file   Number of children: Not on file   Years of education: Not on file   Highest education level: Not on file  Occupational  History   Not on file  Tobacco Use   Smoking status: Never   Smokeless tobacco: Never  Substance and Sexual Activity   Alcohol use: No   Drug use: Never   Sexual activity: Not Currently  Other Topics Concern   Not on file  Social History Narrative   Not on file   Social Determinants of Health   Financial Resource Strain: Not on file  Food Insecurity: Not on file  Transportation Needs: Not on file  Physical Activity: Not on file  Stress: Not on file  Social Connections: Not on file    Allergies: Not on File  Metabolic Disorder Labs: No results found for: "HGBA1C", "MPG" No results found for: "PROLACTIN" No results found for: "CHOL", "TRIG", "HDL", "CHOLHDL", "VLDL", "LDLCALC" No results found for: "TSH"  Therapeutic Level Labs: No results found for: "LITHIUM" No results found for: "VALPROATE" No results found for: "CBMZ"  Current Medications: Current Outpatient Medications  Medication Sig Dispense Refill   ARIPiprazole ER (ABILIFY MAINTENA) 400 MG SRER injection Inject 2 mLs (400 mg total) into the muscle every 28 (twenty-eight) days. 1 each 3   No current facility-administered medications for this visit.     Musculoskeletal: Strength & Muscle Tone: within normal limits Gait & Station: normal Patient leans: N/A  Psychiatric Specialty Exam: Review of Systems  Constitutional: Negative.   HENT: Negative.    Eyes: Negative.   Respiratory: Negative.    Cardiovascular: Negative.   Gastrointestinal: Negative.   Genitourinary: Negative.  Musculoskeletal: Negative.   Skin: Negative.   Neurological: Negative.   Hematological: Negative.   Psychiatric/Behavioral:  Negative for agitation (Denies), behavioral problems, decreased concentration, dysphoric mood (Denies), hallucinations (Denies), self-injury (Denies), sleep disturbance (Denies) and suicidal ideas (Denies). The patient is not nervous/anxious (Denies).     There were no vitals taken for this visit.There  is no height or weight on file to calculate BMI.  General Appearance: Casual and appropriate  Eye Contact:  Good  Speech:  Clear and Coherent and Normal Rate  Volume:  Normal  Mood:  Euthymic  Affect:  Appropriate and Congruent  Thought Process:  Coherent, Goal Directed, and Descriptions of Associations: Intact  Orientation:  Full (Time, Place, and Person)  Thought Content: Logical   Suicidal Thoughts:  No  Homicidal Thoughts:  No  Memory:  Immediate;   Good Recent;   Good Remote;   Good  Judgement:  Intact  Insight:  Present  Psychomotor Activity:  Normal  Concentration:  Concentration: Good and Attention Span: Good  Recall:  Good  Fund of Knowledge: Good  Language: Good  Akathisia:  No  Handed:  Right  AIMS (if indicated): done  Assets:  Communication Skills Desire for Improvement Financial Resources/Insurance Housing Leisure Time Physical Health Resilience Social Support Transportation  ADL's:  Intact  Cognition: WNL  Sleep:  Good   Screenings: Geneticist, molecular Office Visit from 02/09/2022 in [facility] Office Visit from 10/06/2021 in [facility] Clinical Support from 02/08/2021 in [facility]  AIMS Total Score 0 0 0      GAD-7    Flowsheet Row Office Visit from 02/09/2022 in [facility] Office Visit from 10/06/2021 in [facility] Clinical Support from 02/08/2021 in [facility] Video Visit from 11/09/2020 in [facility]  Total GAD-7 Score 0 0 0 1      PHQ2-9    Flowsheet Row Office Visit from 02/09/2022 in [facility] Office Visit from 10/06/2021 in [facility] Office Visit from 05/17/2021 in [facility] Clinical Support from 02/08/2021 in [facility]  Video Visit from 11/09/2020 in [facility]  PHQ-2 Total Score 0 0 0 0 0  PHQ-9 Total Score -- -- -- -- 0      Flowsheet Row Office Visit from 02/09/2022 in [facility] Office Visit from 10/06/2021 in [facility] Office Visit from 05/17/2021 in [facility]  C-SSRS RISK CATEGORY No Risk No Risk No Risk      Assessment and Plan: DEWEY VIENS appears to be doing well.  He reports Abilify Roderic Palau continues to effectively manage his mental health without adverse reaction.  He reports he is doing a lot better since starting the Abilify Maintena.  During this visit he is dressed appropriate for weather.  He is sitting upright in chair with no noted distress.  He is alert/oriented x 4, calm, cooperative, pleasant, and mood is congruent with affect.  He spoke in a clear tone at moderate volume, and normal pace, with good eye contact.  His thought process is coherent, relevant, and there is no indication that he is currently responding to internal/external stimuli or experiencing delusional thought content.  He denies depression, anxiety, suicidal/self-harm/homicidal ideation, psychosis, paranoia, fluctuations in mood, and abnormal movements.  1. Schizoaffective disorder, bipolar type (HCC) - TSH; Future - Lipid Profile; Future - CBC with Differential; Future - COMPLETE METABOLIC PANEL WITH GFR; Future - HgB A1c; Future - Prolactin; Future - Magnesium; Future - ARIPiprazole ER (ABILIFY MAINTENA) 400 MG SRER injection; Inject 2 mLs (400 mg total) into the muscle every 28 (twenty-eight) days.  Dispense: 1 each; Refill: 3    Collaboration of Care: Collaboration of Care: Medication Management AEB Administration of long acting injectable and medication refill and Other Nursing staff and counselor Meds ordered this encounter  Medications   ARIPiprazole ER (ABILIFY MAINTENA) 400 MG SRER injection     Sig: Inject 2 mLs (400 mg total) into the muscle every 28 (twenty-eight) days.    Dispense:  1 each    Refill:  3    Order Specific Question:   Supervising Provider    Answer:   Nelly Rout [[id number]]    Patient/Guardian was advised Release of Information must be obtained prior to any record release in order to collaborate their care with an outside provider. Patient/Guardian was advised if they have not already done so to contact the registration department to sign all necessary forms in order for [place] to release information regarding their care.   Consent: Patient/Guardian gives verbal consent for treatment and assignment of benefits for services provided during this visit. Patient/Guardian expressed understanding and agreed to proceed.   Loria Lacina, NP 02/09/2022, 9:06 AM

## 2022-02-09 NOTE — Patient Instructions (Signed)
Will need to have labs drawn at next visit with injection CBC/Diff Lipid panel CMP TSH HgbA1c Prolactin Magnesium

## 2022-03-14 ENCOUNTER — Ambulatory Visit (HOSPITAL_COMMUNITY): Payer: Medicaid Other

## 2022-03-16 ENCOUNTER — Ambulatory Visit (INDEPENDENT_AMBULATORY_CARE_PROVIDER_SITE_OTHER): Payer: Medicaid Other

## 2022-03-16 ENCOUNTER — Encounter (HOSPITAL_COMMUNITY): Payer: Self-pay

## 2022-03-16 VITALS — BP 145/95 | HR 91 | Ht 70.0 in | Wt 150.2 lb

## 2022-03-16 DIAGNOSIS — F250 Schizoaffective disorder, bipolar type: Secondary | ICD-10-CM

## 2022-03-16 MED ORDER — ARIPIPRAZOLE ER 400 MG IM PRSY
400.0000 mg | PREFILLED_SYRINGE | Freq: Once | INTRAMUSCULAR | Status: AC
  Administered 2022-03-16: 400 mg via INTRAMUSCULAR

## 2022-03-16 NOTE — Progress Notes (Signed)
Patient arrived for his injection: ARIPiprazole ER (ABILIFY MAINTENA) 400 MG    Pleasant as Always. Tolerated injection well in Right-Arm.  His blood pressure was elevated I asked him was he on any preventative medications he replied  "YES" and his PCP is aware of the elevation   NO HI/SI NOR AH/VH

## 2022-04-13 ENCOUNTER — Encounter (HOSPITAL_COMMUNITY): Payer: Self-pay

## 2022-04-13 ENCOUNTER — Ambulatory Visit (HOSPITAL_COMMUNITY): Payer: Medicaid Other | Admitting: *Deleted

## 2022-04-13 VITALS — BP 143/76 | HR 80 | Ht 70.0 in | Wt 151.0 lb

## 2022-04-13 DIAGNOSIS — F250 Schizoaffective disorder, bipolar type: Secondary | ICD-10-CM

## 2022-04-13 NOTE — Progress Notes (Cosign Needed)
Patient Arrived for Monthly ARIPiprazole ER (ABILIFY MAINTENA) 400 MG Injection Tolerated injection well in Left Arm. Pleasant as Always NO HI/SI   NOR AH/VH

## 2022-05-11 ENCOUNTER — Ambulatory Visit (INDEPENDENT_AMBULATORY_CARE_PROVIDER_SITE_OTHER): Payer: Medicaid Other | Admitting: Psychiatry

## 2022-05-11 ENCOUNTER — Encounter (HOSPITAL_COMMUNITY): Payer: Self-pay

## 2022-05-11 ENCOUNTER — Ambulatory Visit (INDEPENDENT_AMBULATORY_CARE_PROVIDER_SITE_OTHER): Payer: Medicaid Other

## 2022-05-11 VITALS — BP 127/81 | HR 74 | Ht 70.0 in | Wt 152.0 lb

## 2022-05-11 DIAGNOSIS — F250 Schizoaffective disorder, bipolar type: Secondary | ICD-10-CM | POA: Diagnosis not present

## 2022-05-11 MED ORDER — ARIPIPRAZOLE ER 400 MG IM SRER: 400.0000 mg | INTRAMUSCULAR | 11 refills | Status: DC

## 2022-05-11 MED ORDER — ARIPIPRAZOLE ER 400 MG IM PRSY
400.0000 mg | PREFILLED_SYRINGE | Freq: Once | INTRAMUSCULAR | Status: AC
  Administered 2022-05-11: 400 mg via INTRAMUSCULAR

## 2022-05-11 NOTE — Progress Notes (Signed)
Patient Arrived for Monthly ARIPiprazole ER (ABILIFY MAINTENA) 400 MG Injection Tolerated injection well in Right Arm. Pleasant as Always NO HI/SI   NOR AH/VH  

## 2022-05-11 NOTE — Progress Notes (Signed)
[facility] MD/PA/NP OP Progress Note  05/11/2022 12:55 PM Hector Wright  MRN:  [id number]  Chief Complaint: "I am doing well"  HPI: 37 year old male seen today for follow up psychiatric evaluation. She has a psychiatric history of schizoaffective disorder bipolar type. He is currently managed on Abilify 400 mg monthly. He notes that his medications are effective in managing his psychiatric conditions.   Today he was well groom, pleasant, cooperative, and engaged in conversation. He reports that he is doing well. He notes that he stays busy working. Patient notes that he enjoyed the holiday with his family. Today he denies SI/HI/VAH, mania or paranoia.   Patient notes that her anxiety and depression are well managed. Today provider conduted a GAD 7 and patient scored a 2. Provider also conducted a PHQ 9 and patient scored a 1. He endorses adequate sleep and appetite.   No medication changes made today. Patient agreeable to continue medications as prescribed. No other concerns noted at this time.    Visit Diagnosis: No diagnosis found.  Past Psychiatric History:  schizoaffective disorder bipolar type  Past Medical History: No past medical history on file. No past surgical history on file.  Family Psychiatric History: None reported   Family History: No family history on file.  Social History:  Social History   Socioeconomic History   Marital status: Single    Spouse name: Not on file   Number of children: Not on file   Years of education: Not on file   Highest education level: Not on file  Occupational History   Not on file  Tobacco Use   Smoking status: Never   Smokeless tobacco: Never  Substance and Sexual Activity   Alcohol use: No   Drug use: Never   Sexual activity: Not Currently  Other Topics Concern   Not on file  Social History Narrative   Not on file   Social Determinants of Health   Financial Resource Strain: Not on file  Food Insecurity: Not on file  Transportation  Needs: Not on file  Physical Activity: Not on file  Stress: Not on file  Social Connections: Not on file    Allergies: Not on File  Metabolic Disorder Labs: No results found for: "HGBA1C", "MPG" No results found for: "PROLACTIN" No results found for: "CHOL", "TRIG", "HDL", "CHOLHDL", "VLDL", "LDLCALC" No results found for: "TSH"  Therapeutic Level Labs: No results found for: "LITHIUM" No results found for: "VALPROATE" No results found for: "CBMZ"  Current Medications: Current Outpatient Medications  Medication Sig Dispense Refill   ARIPiprazole ER (ABILIFY MAINTENA) 400 MG SRER injection Inject 2 mLs (400 mg total) into the muscle every 28 (twenty-eight) days. 1 each 3   No current facility-administered medications for this visit.     Musculoskeletal: Strength & Muscle Tone: within normal limits Gait & Station: normal Patient leans: N/A  Psychiatric Specialty Exam: Review of Systems  There were no vitals taken for this visit.There is no height or weight on file to calculate BMI.  General Appearance: Well Groomed  Eye Contact:  Good  Speech:  Clear and Coherent and Normal Rate  Volume:  Normal  Mood:  Euthymic  Affect:  Appropriate and Congruent  Thought Process:  Coherent, Goal Directed, and Linear  Orientation:  Full (Time, Place, and Person)  Thought Content: WDL and Logical   Suicidal Thoughts:  No  Homicidal Thoughts:  No  Memory:  Immediate;   Good Recent;   Good Remote;   Good  Judgement:  Good  Insight:  Good  Psychomotor Activity:  Normal  Concentration:  Concentration: Good and Attention Span: Good  Recall:  Good  Fund of Knowledge: Good  Language: Good  Akathisia:  No  Handed:  Right  AIMS (if indicated): not done  Assets:  Communication Skills Desire for Improvement Financial Resources/Insurance Housing Leisure Time Physical Health Social Support  ADL's:  Intact  Cognition: WNL  Sleep:  Good   Screenings: [place]  Office Visit from 02/09/2022 in [facility] Office Visit from 10/06/2021 in [place] from 02/08/2021 in [place] Total Score 0 0 0      GAD-7    [place] Office Visit from 02/09/2022 in [facility] Office Visit from 10/06/2021 in [place] from 02/08/2021 in [facility] Video Visit from 11/09/2020 in [facility]  Total GAD-7 Score 0 0 0 1      PHQ2-9    [place] Office Visit from 02/09/2022 in [facility] Office Visit from 10/06/2021 in [facility] Office Visit from 05/17/2021 in [place] from 02/08/2021 in [facility] Video Visit from 11/09/2020 in [facility]  PHQ-2 Total Score 0 0 0 0 0  PHQ-9 Total Score -- -- -- -- 0      Flowsheet Row Office Visit from 02/09/2022 in [facility] Office Visit from 10/06/2021 in [facility] Office Visit from 05/17/2021 in [place] No Risk No Risk No Risk        Assessment and Plan: Patient notes that he is doing well on his current medication regimen. No medication changes made today. Patient agreeable to continue medications as prescribed.   1. Schizoaffective disorder, bipolar type ([place])  Continue- ARIPiprazole ER (ABILIFY MAINTENA) 400 MG SRER injection; Inject 2 mLs (400 mg total) into the muscle every 28 (twenty-eight) days.  Dispense: 1 each; Refill: 11  Collaboration of Care: Collaboration of Care: Other provider involved in patient's care AEB Shot clinic and PCP  Patient/Guardian was advised Release of Information must be obtained  prior to any record release in order to collaborate their care with an outside provider. Patient/Guardian was advised if they have not already done so to contact the registration department to sign all necessary forms in order for [place] to release information regarding their care.   Consent: Patient/Guardian gives verbal consent for treatment and assignment of benefits for services provided during this visit. Patient/Guardian expressed understanding and agreed to proceed.   Follow up in 3 months  Salley Slaughter, NP 05/11/2022, 12:55 PM

## 2022-05-18 ENCOUNTER — Telehealth (HOSPITAL_COMMUNITY): Payer: Self-pay | Admitting: *Deleted

## 2022-05-18 NOTE — Telephone Encounter (Signed)
PA obtained for patients Abilify M 400 mg inj. Pharmacy notified. Authorization is good till 05/13/23.

## 2022-05-23 ENCOUNTER — Telehealth (HOSPITAL_COMMUNITY): Payer: Self-pay

## 2022-05-23 NOTE — Telephone Encounter (Signed)
Medication management - Telephone call with Nermine, representative with Medicaid [employer] to assist patient with completing a prior authorization for his prescribed Abilify Maintena 400 mg IM medication. Approved until 05/18/23, [id number]. Called patient's [facility] on [address] to inform of approved medication.

## 2022-06-08 ENCOUNTER — Ambulatory Visit (HOSPITAL_COMMUNITY): Payer: Medicaid Other

## 2022-06-13 ENCOUNTER — Ambulatory Visit (INDEPENDENT_AMBULATORY_CARE_PROVIDER_SITE_OTHER): Payer: Medicaid Other

## 2022-06-13 ENCOUNTER — Encounter (HOSPITAL_COMMUNITY): Payer: Self-pay

## 2022-06-13 VITALS — BP 149/82 | HR 60 | Ht 70.0 in | Wt 152.0 lb

## 2022-06-13 DIAGNOSIS — F250 Schizoaffective disorder, bipolar type: Secondary | ICD-10-CM

## 2022-06-13 MED ORDER — ARIPIPRAZOLE ER 400 MG IM PRSY
400.0000 mg | PREFILLED_SYRINGE | Freq: Once | INTRAMUSCULAR | Status: AC
  Administered 2022-06-13: 400 mg via INTRAMUSCULAR

## 2022-06-13 NOTE — Progress Notes (Signed)
Patient Arrived for Monthly ARIPiprazole ER (ABILIFY MAINTENA) 400 MG Injection Tolerated injection well in LEFT  Arm. Pleasant as Always NO HI/SI   NOR AH/VH

## 2022-06-14 ENCOUNTER — Ambulatory Visit (HOSPITAL_COMMUNITY): Payer: Medicaid Other

## 2022-06-14 ENCOUNTER — Encounter (HOSPITAL_COMMUNITY): Payer: Self-pay

## 2022-07-11 ENCOUNTER — Encounter (HOSPITAL_COMMUNITY): Payer: Self-pay

## 2022-07-11 ENCOUNTER — Ambulatory Visit (INDEPENDENT_AMBULATORY_CARE_PROVIDER_SITE_OTHER): Payer: Medicaid Other

## 2022-07-11 VITALS — BP 147/87 | HR 81 | Ht 70.0 in | Wt 149.4 lb

## 2022-07-11 DIAGNOSIS — F250 Schizoaffective disorder, bipolar type: Secondary | ICD-10-CM

## 2022-07-11 MED ORDER — ARIPIPRAZOLE ER 400 MG IM PRSY
400.0000 mg | PREFILLED_SYRINGE | Freq: Once | INTRAMUSCULAR | Status: AC
  Administered 2022-07-11: 400 mg via INTRAMUSCULAR

## 2022-07-11 NOTE — Progress Notes (Signed)
Patient Arrived for Monthly ARIPiprazole ER (ABILIFY MAINTENA) 400 MG Injection Tolerated injection well in Right Arm. Pleasant as Always NO HI/SI   NOR AH/VH

## 2022-08-03 ENCOUNTER — Ambulatory Visit (HOSPITAL_COMMUNITY): Payer: Medicaid Other

## 2022-08-08 ENCOUNTER — Encounter (HOSPITAL_COMMUNITY): Payer: Self-pay

## 2022-08-08 ENCOUNTER — Ambulatory Visit (INDEPENDENT_AMBULATORY_CARE_PROVIDER_SITE_OTHER): Payer: Medicaid Other | Admitting: *Deleted

## 2022-08-08 ENCOUNTER — Ambulatory Visit (INDEPENDENT_AMBULATORY_CARE_PROVIDER_SITE_OTHER): Payer: Medicaid Other | Admitting: Psychiatry

## 2022-08-08 VITALS — BP 148/89 | HR 91 | Ht 70.0 in | Wt 146.0 lb

## 2022-08-08 DIAGNOSIS — F250 Schizoaffective disorder, bipolar type: Secondary | ICD-10-CM

## 2022-08-08 MED ORDER — ARIPIPRAZOLE ER 400 MG IM SRER: 400.0000 mg | INTRAMUSCULAR | 11 refills | Status: DC

## 2022-08-08 MED ORDER — ARIPIPRAZOLE ER 400 MG IM PRSY
400.0000 mg | PREFILLED_SYRINGE | Freq: Once | INTRAMUSCULAR | Status: AC
  Administered 2022-08-08: 400 mg via INTRAMUSCULAR

## 2022-08-08 NOTE — Progress Notes (Signed)
[facility] MD/PA/NP OP Progress Note  08/08/2022 8:37 AM Hector Wright  MRN:  [id number]  Chief Complaint: "I okay"  HPI: 37 year old male seen today for follow up psychiatric evaluation. She has a psychiatric history of schizoaffective disorder bipolar type. He is currently managed on Abilify 400 mg monthly. He notes that his medications are effective in managing his psychiatric conditions.   Today he was well groom, pleasant, cooperative, and engaged in conversation. He reports that he is doing okay. He notes that he spends his time in the gym and outdoors. He notes that his mood is stable and notes that he has minimal anxiety and depression. Today provider conduted a GAD 7 and patient scored a 2, at his last visit he scored a 2. Provider also conducted a PHQ 9 and patient scored a 4, at his last visit he scored a 1. He endorses adequate sleep and appetite. He denies SI/HI/VH. Patient notes that he has [place] noting that he can't determine what his voices are saying. He describes them as negative vibes which he can cope with.     No medication changes made today. Patient agreeable to continue medications as prescribed. No other concerns noted at this time.    Visit Diagnosis:    ICD-10-CM   1. Schizoaffective disorder, bipolar type  F25.0 ARIPiprazole ER (ABILIFY MAINTENA) 400 MG SRER injection      Past Psychiatric History:  schizoaffective disorder bipolar type  Past Medical History: No past medical history on file. No past surgical history on file.  Family Psychiatric History: None reported   Family History: No family history on file.  Social History:  Social History   Socioeconomic History   Marital status: Single    Spouse name: Not on file   Number of children: Not on file   Years of education: Not on file   Highest education level: Not on file  Occupational History   Not on file  Tobacco Use   Smoking status: Never   Smokeless tobacco: Never  Substance and Sexual Activity    Alcohol use: No   Drug use: Never   Sexual activity: Not Currently  Other Topics Concern   Not on file  Social History Narrative   Not on file   Social Determinants of Health   Financial Resource Strain: Not on file  Food Insecurity: Not on file  Transportation Needs: Not on file  Physical Activity: Not on file  Stress: Not on file  Social Connections: Not on file    Allergies: Not on File  Metabolic Disorder Labs: No results found for: "HGBA1C", "MPG" No results found for: "PROLACTIN" No results found for: "CHOL", "TRIG", "HDL", "CHOLHDL", "VLDL", "LDLCALC" No results found for: "TSH"  Therapeutic Level Labs: No results found for: "LITHIUM" No results found for: "VALPROATE" No results found for: "CBMZ"  Current Medications: Current Outpatient Medications  Medication Sig Dispense Refill   ARIPiprazole ER (ABILIFY MAINTENA) 400 MG SRER injection Inject 2 mLs (400 mg total) into the muscle every 28 (twenty-eight) days. 1 each 11   No current facility-administered medications for this visit.     Musculoskeletal: Strength & Muscle Tone: within normal limits Gait & Station: normal Patient leans: N/A  Psychiatric Specialty Exam: Review of Systems  There were no vitals taken for this visit.There is no height or weight on file to calculate BMI.  General Appearance: Well Groomed  Eye Contact:  Good  Speech:  Clear and Coherent and Normal Rate  Volume:  Normal  Mood:  Euthymic  Affect:  Appropriate and Congruent  Thought Process:  Coherent, Goal Directed, and Linear  Orientation:  Full (Time, Place, and Person)  Thought Content: WDL and Logical   Suicidal Thoughts:  No  Homicidal Thoughts:  No  Memory:  Immediate;   Good Recent;   Good Remote;   Good  Judgement:  Good  Insight:  Good  Psychomotor Activity:  Normal  Concentration:  Concentration: Good and Attention Span: Good  Recall:  Good  Fund of Knowledge: Good  Language: Good  Akathisia:  No  Handed:   Right  AIMS (if indicated): not done  Assets:  Communication Skills Desire for Improvement Financial Resources/Insurance Housing Leisure Time Physical Health Social Support  ADL's:  Intact  Cognition: WNL  Sleep:  Good   Screenings: [place] Office Visit from 02/09/2022 in [facility] Office Visit from 10/06/2021 in [place] from 02/08/2021 in [place] Total Score 0 0 0      GAD-7    [place] Office Visit from 08/08/2022 in [facility] Office Visit from 05/11/2022 in [facility] Office Visit from 02/09/2022 in [facility] Office Visit from 10/06/2021 in [place] from 02/08/2021 in [facility]  Total GAD-7 Score 2 2 0 0 0      PHQ2-9    [place] Office Visit from 08/08/2022 in [facility] Office Visit from 05/11/2022 in [facility]/09/2022 [facility] Office Visit from 10/06/2021 in [facility] Office Visit from 05/17/2021 in [facility]  PHQ-2 Total Score 0 0 0 0 0  PHQ-9 Total Score 4 1 -- -- --      [place] Office Visit from 02/09/2022 in [facility] Office Visit from 10/06/2021 in [facility] Office Visit from 05/17/2021 in [place] No Risk No Risk No Risk        Assessment and Plan: Patient notes that he is doing well on his current medication regimen. He does note that can cope with it. No medication changes made today. Patient agreeable to continue medications as prescribed.   1. Schizoaffective disorder,  bipolar type ([place])  Continue- ARIPiprazole ER (ABILIFY MAINTENA) 400 MG SRER injection; Inject 2 mLs (400 mg total) into the muscle every 28 (twenty-eight) days.  Dispense: 1 each; Refill: 11  Collaboration of Care: Collaboration of Care: Other provider involved in patient's care AEB Shot clinic and PCP  Patient/Guardian was advised Release of Information must be obtained prior to any record release in order to collaborate their care with an outside provider. Patient/Guardian was advised if they have not already done so to contact the registration department to sign all necessary forms in order for [place] to release information regarding their care.   Consent: Patient/Guardian gives verbal consent for treatment and assignment of benefits for services provided during this visit. Patient/Guardian expressed understanding and agreed to proceed.   Follow up in 3 months  Salley Slaughter, NP 08/08/2022, 8:37 AM

## 2022-08-08 NOTE — Patient Instructions (Signed)
Patient arrived for injection ARIPiprazole ER (ABILIFY MAINTENA) 400 MG SRER injection . Tolerated injection well in Left-Arm.Pleasant as always . Stated that everything is going well ARIPiprazole ER (ABILIFY MAINTENA) 400 MG SRER injection

## 2022-08-08 NOTE — Progress Notes (Cosign Needed)
Patient arrived for injection ARIPiprazole ER (ABILIFY MAINTENA) 400 MG SRER injection . Tolerated injection well in Left-Arm.Pleasant as always . Stated that everything is going well ARIPiprazole ER (ABILIFY MAINTENA) 400 MG SRER injection

## 2022-09-01 ENCOUNTER — Other Ambulatory Visit (HOSPITAL_COMMUNITY): Payer: Self-pay | Admitting: Psychiatry

## 2022-09-05 ENCOUNTER — Telehealth (HOSPITAL_COMMUNITY): Payer: Self-pay | Admitting: *Deleted

## 2022-09-05 ENCOUNTER — Ambulatory Visit (HOSPITAL_COMMUNITY): Payer: Medicaid Other

## 2022-09-05 NOTE — Telephone Encounter (Signed)
Fax received for Abilify Maintena, called [employer] spoke with Tresa Endo who gave approval unitl 08/31/23. Prior Berkley Harvey #[id number]. Called to notify pharmacy.

## 2022-09-07 ENCOUNTER — Ambulatory Visit (INDEPENDENT_AMBULATORY_CARE_PROVIDER_SITE_OTHER): Payer: Medicaid Other | Admitting: *Deleted

## 2022-09-07 VITALS — BP 133/87 | HR 100 | Ht 70.0 in | Wt 143.0 lb

## 2022-09-07 DIAGNOSIS — F250 Schizoaffective disorder, bipolar type: Secondary | ICD-10-CM

## 2022-09-07 MED ORDER — ARIPIPRAZOLE ER 400 MG IM PRSY
400.0000 mg | PREFILLED_SYRINGE | Freq: Once | INTRAMUSCULAR | Status: AC
  Administered 2022-09-07: 400 mg via INTRAMUSCULAR

## 2022-09-07 NOTE — Progress Notes (Signed)
In as scheduled for his monthly inj of Abilify M 400 mg. He got his shot today in his R DELTOID without issue. He is doing well. He is maintaining working out and its very noticeable in his biceps. He is continuing in a relationship with his girlfriend, living with his mom and not interested at this time in employment. He is to return in 28 days for his next inj.

## 2022-09-27 ENCOUNTER — Ambulatory Visit (HOSPITAL_COMMUNITY)
Admission: EM | Admit: 2022-09-27 | Discharge: 2022-09-27 | Disposition: A | Discharge status: Home / Self Care | Payer: Medicaid Other

## 2022-09-27 ENCOUNTER — Encounter (HOSPITAL_COMMUNITY): Payer: Self-pay

## 2022-09-27 DIAGNOSIS — B09 Unspecified viral infection characterized by skin and mucous membrane lesions: Secondary | ICD-10-CM

## 2022-09-27 NOTE — Discharge Instructions (Signed)
At this time, because you do not have any itching or pain with your rash, I do not recommend any intervention at this time.  Rash is likely related to exposure to a virus that causes no respiratory symptoms but did cause a reaction in your skin.  This rash should resolve in the next 2 to 3 weeks.  If it does not resolve on its own without intervention, please follow-up with your primary care provider or see a dermatologist.  Thank you for visiting [place] urgent care today.

## 2022-09-27 NOTE — ED Triage Notes (Signed)
Pt reports to office for rash on his back and arms x 2 days. Pt has not used any new soaps or detergents.

## 2022-09-27 NOTE — ED Provider Notes (Addendum)
[facility]    CSN: [id number] Arrival date & time: 09/27/22  1129    HISTORY   Chief Complaint  Patient presents with   Rash   HPI Hector Wright is a pleasant, 37 y.o. male who presents to urgent care today. Patient reports acute onset of nonpruritic, raised maculopapular rash with mild scale.  Patient states he has not had similar rash like this in the past.  Patient states the rash is limited to his trunk above his baseline and at the base of his neck sparing his arms, groin area legs hands and feet.  Patient denies recent illness, sore throat, fever, body aches, chills, headache or exposure to someone with a similar rash.  Patient states has not tried anything to alleviate the rash as it is not really bothering him.  The history is provided by the patient.   History reviewed. No pertinent past medical history. Patient Active Problem List   Diagnosis Date Noted   Schizoaffective disorder, bipolar type (HCC) 12/18/2019   History reviewed. No pertinent surgical history.  Home Medications    Prior to Admission medications   Medication Sig Start Date End Date Taking? Authorizing Provider  ARIPiprazole ER (ABILIFY MAINTENA) 400 MG SRER injection Inject 2 mLs (400 mg total) into the muscle every 28 (twenty-eight) days. 08/08/22   Shanna Cisco, NP    Family History History reviewed. No pertinent family history. Social History Social History   Tobacco Use   Smoking status: Never   Smokeless tobacco: Never  Substance Use Topics   Alcohol use: No   Drug use: Never   Allergies   Patient has no allergy information on record.  Review of Systems Review of Systems Pertinent findings revealed after performing a 14 point review of systems has been noted in the history of present illness.  Physical Exam Vital Signs BP (!) 180/89 (BP Location: Left Arm)   Pulse 95   Temp 98.1 F (36.7 C) (Oral)   Resp 18   SpO2 98%   No data found.  Physical  Exam Vitals and nursing note reviewed.  Constitutional:      General: He is not in acute distress.    Appearance: Normal appearance. He is normal weight. He is not ill-appearing.  HENT:     Head: Normocephalic and atraumatic.  Eyes:     Extraocular Movements: Extraocular movements intact.     Conjunctiva/sclera: Conjunctivae normal.     Pupils: Pupils are equal, round, and reactive to light.  Cardiovascular:     Rate and Rhythm: Normal rate and regular rhythm.  Pulmonary:     Effort: Pulmonary effort is normal.     Breath sounds: Normal breath sounds.  Musculoskeletal:        General: Normal range of motion.     Cervical back: Normal range of motion and neck supple.  Skin:    General: Skin is warm and dry.     Findings: Rash (TNTC macular papular lesions distributed in a Christmas tree pattern both on chest and back without appreciable herald spot, there are no signs of excoriation or drainage, a few lesions are with mild scale) present.  Neurological:     General: No focal deficit present.     Mental Status: He is alert and oriented to person, place, and time. Mental status is at baseline.  Psychiatric:        Mood and Affect: Mood normal.        Behavior: Behavior normal.  Thought Content: Thought content normal.        Judgment: Judgment normal.     Visual Acuity Right Eye Distance:   Left Eye Distance:   Bilateral Distance:    Right Eye Near:   Left Eye Near:    Bilateral Near:     UC Couse / Diagnostics / Procedures:     Radiology No results found.  Procedures Procedures (including critical care time) EKG  Pending results:  Labs Reviewed - No data to display  Medications Ordered in UC: Medications - No data to display  UC Diagnoses / Final Clinical Impressions(s)   I have reviewed the triage vital signs and the nursing notes.  Pertinent labs & imaging results that were available during my care of the patient were reviewed by me and considered in  my medical decision making (see chart for details).    Final diagnoses:  Viral exanthem   Etiology of rash is uncertain at this time.  Given Christmas tree pattern distribution and lack of morbilliform appearance do not suspect that this is a drug reaction, rash related to bacterial infection but could still be viral exanthem.  Patient advised that if the rash is not causing him to itch or making him uncomfortable that I recommend watchful waiting.  Patient advised if rash not resolved within the next few weeks, he should follow-up with his primary care provider or see dermatologist to for further evaluation and more definitive diagnosis as well as possible treatment.  Please see discharge instructions below for details of plan of care as provided to patient. ED Prescriptions   None    PDMP not reviewed this encounter.  Pending results:  Labs Reviewed - No data to display  Discharge Instructions:   Discharge Instructions      At this time, because you do not have any itching or pain with your rash, I do not recommend any intervention at this time.  Rash is likely related to exposure to a virus that causes no respiratory symptoms but did cause a reaction in your skin.  This rash should resolve in the next 2 to 3 weeks.  If it does not resolve on its own without intervention, please follow-up with your primary care provider or see a dermatologist.  Thank you for visiting [place] urgent care today.       Disposition Upon Discharge:  Condition: stable for discharge home  Patient presented with an acute illness with associated systemic symptoms and significant discomfort requiring urgent management. In my opinion, this is a condition that a prudent lay person (someone who possesses an average knowledge of health and medicine) may potentially expect to result in complications if not addressed urgently such as respiratory distress, impairment of bodily function or dysfunction of bodily  organs.   Routine symptom specific, illness specific and/or disease specific instructions were discussed with the patient and/or caregiver at length.   As such, the patient has been evaluated and assessed, work-up was performed and treatment was provided in alignment with urgent care protocols and evidence based medicine.  Patient/parent/caregiver has been advised that the patient may require follow up for further testing and treatment if the symptoms continue in spite of treatment, as clinically indicated and appropriate.  Patient/parent/caregiver has been advised to return to the [facility] or PCP if no better; to PCP or the Emergency Department if new signs and symptoms develop, or if the current signs or symptoms continue to change or worsen for further workup, evaluation and treatment  as clinically indicated and appropriate  The patient will follow up with their current PCP if and as advised. If the patient does not currently have a PCP we will assist them in obtaining one.   The patient may need specialty follow up if the symptoms continue, in spite of conservative treatment and management, for further workup, evaluation, consultation and treatment as clinically indicated and appropriate.  Patient/parent/caregiver verbalized understanding and agreement of plan as discussed.  All questions were addressed during visit.  Please see discharge instructions below for further details of plan.  This office note has been dictated using Teaching laboratory technician.  Unfortunately, this method of dictation can sometimes lead to typographical or grammatical errors.  I apologize for your inconvenience in advance if this occurs.  Please do not hesitate to reach out to me if clarification is needed.      Theadora Rama Scales, PA-C 09/27/22 1300    Theadora Rama Loma, PA-C 09/27/22 1301    Theadora Rama Scales, PA-C 09/27/22 1302

## 2022-10-05 ENCOUNTER — Ambulatory Visit (HOSPITAL_COMMUNITY): Payer: Medicaid Other

## 2022-10-10 ENCOUNTER — Ambulatory Visit (HOSPITAL_COMMUNITY): Payer: Medicaid Other

## 2022-10-12 ENCOUNTER — Encounter (HOSPITAL_COMMUNITY): Payer: Self-pay

## 2022-10-12 ENCOUNTER — Ambulatory Visit (HOSPITAL_COMMUNITY): Payer: Medicaid Other

## 2022-10-12 ENCOUNTER — Ambulatory Visit (INDEPENDENT_AMBULATORY_CARE_PROVIDER_SITE_OTHER): Payer: Medicaid Other

## 2022-10-12 VITALS — BP 137/83 | HR 70 | Resp 16 | Ht 70.0 in | Wt 146.6 lb

## 2022-10-12 DIAGNOSIS — F250 Schizoaffective disorder, bipolar type: Secondary | ICD-10-CM | POA: Diagnosis not present

## 2022-10-12 MED ORDER — ARIPIPRAZOLE ER 400 MG IM PRSY
400.0000 mg | PREFILLED_SYRINGE | Freq: Once | INTRAMUSCULAR | Status: AC
Start: 2022-10-12 — End: 2022-10-12
  Administered 2022-10-12: 400 mg via INTRAMUSCULAR

## 2022-10-12 NOTE — Progress Notes (Cosign Needed)
Patient Arrived for Monthly ARIPiprazole ER (ABILIFY MAINTENA) 400 MG Injection Tolerated injection well in LEFT  Arm. Pleasant as Always NO HI/SI   NOR AH/VH  

## 2022-11-14 ENCOUNTER — Encounter (HOSPITAL_COMMUNITY): Payer: Self-pay

## 2022-11-14 ENCOUNTER — Ambulatory Visit (INDEPENDENT_AMBULATORY_CARE_PROVIDER_SITE_OTHER): Payer: MEDICAID | Admitting: *Deleted

## 2022-11-14 ENCOUNTER — Ambulatory Visit (INDEPENDENT_AMBULATORY_CARE_PROVIDER_SITE_OTHER): Payer: MEDICAID | Admitting: Psychiatry

## 2022-11-14 VITALS — BP 149/87 | HR 74 | Resp 16 | Ht 70.0 in | Wt 150.0 lb

## 2022-11-14 DIAGNOSIS — F250 Schizoaffective disorder, bipolar type: Secondary | ICD-10-CM

## 2022-11-14 MED ORDER — ARIPIPRAZOLE ER 400 MG IM SRER: 400.0000 mg | INTRAMUSCULAR | 11 refills | Status: DC

## 2022-11-14 MED ORDER — ARIPIPRAZOLE ER 400 MG IM PRSY
400.00 mg | PREFILLED_SYRINGE | Freq: Once | INTRAMUSCULAR | Status: AC
Start: 2022-11-14 — End: 2022-11-14
  Administered 2022-11-14: 400 mg via INTRAMUSCULAR

## 2022-11-14 NOTE — Progress Notes (Signed)
BH MD/PA/NP OP Progress Note  11/14/2022 11:10 AM Hector Wright  MRN:  [id number]  Chief Complaint: "I have heard no voices in a week"  HPI: 37 year old male seen today for follow up psychiatric evaluation. He has a psychiatric history of schizoaffective disorder bipolar type. He is currently managed on Abilify 400 mg monthly. He notes that his medications are effective in managing his psychiatric conditions.   Today he was well groom, pleasant, cooperative, and engaged in conversation. He informed Clinical research associate that he has not heard any voices in a week.  He notes that he has been more active in the gym.  He also notes that he spends his time listening to music.  Patient notes that his mood is stable and reports that he has minimal anxiety and depression.  Today provider conducted GAD-7 and patient scored a 2, at his last visit he scored a 2.  Provider also conducted PHQ-9 and patient scored a 1, at his last visit he scored a 4.  He endorses adequate sleep and appetite.  Today he denies SI/HI/VAH, mania, or paranoia.    Provider conducted an aims assessment and patient scored a 0.  No medication changes made today. Patient agreeable to continue medications as prescribed. No other concerns noted at this time.    Visit Diagnosis:    ICD-10-CM   1. Schizoaffective disorder, bipolar type (HCC)  F25.0 ARIPiprazole ER (ABILIFY MAINTENA) 400 MG SRER injection      Past Psychiatric History:  schizoaffective disorder bipolar type  Past Medical History: No past medical history on file. No past surgical history on file.  Family Psychiatric History: None reported   Family History: No family history on file.  Social History:  Social History   Socioeconomic History   Marital status: Single    Spouse name: Not on file   Number of children: Not on file   Years of education: Not on file   Highest education level: Not on file  Occupational History   Not on file  Tobacco Use   Smoking status: Never    Smokeless tobacco: Never  Substance and Sexual Activity   Alcohol use: No   Drug use: Never   Sexual activity: Not Currently  Other Topics Concern   Not on file  Social History Narrative   Not on file   Social Determinants of Health   Financial Resource Strain: Not on file  Food Insecurity: Not on file  Transportation Needs: Not on file  Physical Activity: Not on file  Stress: Not on file  Social Connections: Not on file    Allergies: Not on File  Metabolic Disorder Labs: No results found for: "HGBA1C", "MPG" No results found for: "PROLACTIN" No results found for: "CHOL", "TRIG", "HDL", "CHOLHDL", "VLDL", "LDLCALC" No results found for: "TSH"  Therapeutic Level Labs: No results found for: "LITHIUM" No results found for: "VALPROATE" No results found for: "CBMZ"  Current Medications: Current Outpatient Medications  Medication Sig Dispense Refill   ARIPiprazole ER (ABILIFY MAINTENA) 400 MG SRER injection Inject 2 mLs (400 mg total) into the muscle every 28 (twenty-eight) days. 1 each 11   No current facility-administered medications for this visit.     Musculoskeletal: Strength & Muscle Tone: within normal limits Gait & Station: normal Patient leans: N/A  Psychiatric Specialty Exam: Review of Systems  There were no vitals taken for this visit.There is no height or weight on file to calculate BMI.  General Appearance: Well Groomed  Eye Contact:  Good  Speech:  Clear and Coherent and Normal Rate  Volume:  Normal  Mood:  Euthymic  Affect:  Appropriate and Congruent  Thought Process:  Coherent, Goal Directed, and Linear  Orientation:  Full (Time, Place, and Person)  Thought Content: WDL and Logical   Suicidal Thoughts:  No  Homicidal Thoughts:  No  Memory:  Immediate;   Good Recent;   Good Remote;   Good  Judgement:  Good  Insight:  Good  Psychomotor Activity:  Normal  Concentration:  Concentration: Good and Attention Span: Good  Recall:  Good  Fund of  Knowledge: Good  Language: Good  Akathisia:  No  Handed:  Right  AIMS (if indicated):  done, 0  Assets:  Communication Skills Desire for Improvement Financial Resources/Insurance Housing Leisure Time Physical Health Social Support  ADL's:  Intact  Cognition: WNL  Sleep:  Good   Screenings: AIMS    Flowsheet Row Office Visit from 02/09/2022 in [facility] Office Visit from 10/06/2021 in [facility] Clinical Support from 02/08/2021 in [facility]  AIMS Total Score 0 0 0      GAD-7    Flowsheet Row Office Visit from 08/08/2022 in [facility] Office Visit from 05/11/2022 in [facility] Office Visit from 02/09/2022 in [facility] Office Visit from 10/06/2021 in [facility] Clinical Support from 02/08/2021 in [facility]  Total GAD-7 Score 2 2 0 0 0      PHQ2-9    Flowsheet Row Office Visit from 08/08/2022 in [facility] Office Visit from 05/11/2022 in [facility] Office Visit from 02/09/2022 in [facility] Office Visit from 10/06/2021 in [facility]  PHQ-2 Total Score 0 0 0 0 0  PHQ-9 Total Score 4 1 -- -- --      Flowsheet Row ED from 09/27/2022 in [facility] Visit from 02/09/2022 in [facility] Office Visit from 10/06/2021 in [facility]  C-SSRS RISK CATEGORY No Risk No Risk No Risk        Assessment and Plan: Patient notes that he is doing well on his current medication regimen.  He reports that he has not had auditory hallucinations in over a.  He denies visual hallucinations or paranoia.  No  medication changes made today.  Patient agreeable to continue medication as prescribed.  1. Schizoaffective disorder, bipolar type (HCC)  Continue- ARIPiprazole ER (ABILIFY MAINTENA) 400 MG SRER injection; Inject 2 mLs (400 mg total) into the muscle every 28 (twenty-eight) days.  Dispense: 1 each; Refill: 11  Collaboration of Care: Collaboration of Care: Other provider involved in patient's care [facility] and PCP  Patient/Guardian was advised Release of Information must be obtained prior to any record release in order to collaborate their care with an outside provider. Patient/Guardian was advised if they have not already done so to contact the registration department to sign all necessary forms in order for [place] to release information regarding their care.   Consent: Patient/Guardian gives verbal consent for treatment and assignment of benefits for services provided during this visit. Patient/Guardian expressed understanding and agreed to proceed.   Follow up in 3 months  Shanna Cisco, NP 11/14/2022, 11:10 AM

## 2022-11-14 NOTE — Progress Notes (Signed)
BH MD/PA/NP OP Progress Note  11/14/2022 10:36 AM Hector Wright  MRN:  [id number]  Chief Complaint: "I okay"  HPI: 37 year old male seen today for follow up psychiatric evaluation. She has a psychiatric history of schizoaffective disorder bipolar type. He is currently managed on Abilify 400 mg monthly. He notes that his medications are effective in managing his psychiatric conditions.   Today he was well groom, pleasant, cooperative, and engaged in conversation. He reports that he is doing okay. He notes that he spends his time in the gym and outdoors. He notes that his mood is stable and notes that he has minimal anxiety and depression. Today provider conduted a GAD 7 and patient scored a 2, at his last visit he scored a 2. Provider also conducted a PHQ 9 and patient scored a 4, at his last visit he scored a 1. He endorses adequate sleep and appetite. He denies SI/HI/VH. Patient notes that he has AH noting that he can't determine what his voices are saying. He describes them as negative vibes which he can cope with.     No medication changes made today. Patient agreeable to continue medications as prescribed. No other concerns noted at this time.    Visit Diagnosis:  No diagnosis found.   Past Psychiatric History:  schizoaffective disorder bipolar type  Past Medical History: No past medical history on file. No past surgical history on file.  Family Psychiatric History: None reported   Family History: No family history on file.  Social History:  Social History   Socioeconomic History   Marital status: Single    Spouse name: Not on file   Number of children: Not on file   Years of education: Not on file   Highest education level: Not on file  Occupational History   Not on file  Tobacco Use   Smoking status: Never   Smokeless tobacco: Never  Substance and Sexual Activity   Alcohol use: No   Drug use: Never   Sexual activity: Not Currently  Other Topics Concern   Not on file   Social History Narrative   Not on file   Social Determinants of Health   Financial Resource Strain: Not on file  Food Insecurity: Not on file  Transportation Needs: Not on file  Physical Activity: Not on file  Stress: Not on file  Social Connections: Not on file    Allergies: Not on File  Metabolic Disorder Labs: No results found for: "HGBA1C", "MPG" No results found for: "PROLACTIN" No results found for: "CHOL", "TRIG", "HDL", "CHOLHDL", "VLDL", "LDLCALC" No results found for: "TSH"  Therapeutic Level Labs: No results found for: "LITHIUM" No results found for: "VALPROATE" No results found for: "CBMZ"  Current Medications: Current Outpatient Medications  Medication Sig Dispense Refill   ARIPiprazole ER (ABILIFY MAINTENA) 400 MG SRER injection Inject 2 mLs (400 mg total) into the muscle every 28 (twenty-eight) days. 1 each 11   No current facility-administered medications for this visit.     Musculoskeletal: Strength & Muscle Tone: within normal limits Gait & Station: normal Patient leans: N/A  Psychiatric Specialty Exam: Review of Systems  There were no vitals taken for this visit.There is no height or weight on file to calculate BMI.  General Appearance: Well Groomed  Eye Contact:  Good  Speech:  Clear and Coherent and Normal Rate  Volume:  Normal  Mood:  Euthymic  Affect:  Appropriate and Congruent  Thought Process:  Coherent, Goal Directed, and Linear  Orientation:  Full (Time, Place, and Person)  Thought Content: WDL and Logical   Suicidal Thoughts:  No  Homicidal Thoughts:  No  Memory:  Immediate;   Good Recent;   Good Remote;   Good  Judgement:  Good  Insight:  Good  Psychomotor Activity:  Normal  Concentration:  Concentration: Good and Attention Span: Good  Recall:  Good  Fund of Knowledge: Good  Language: Good  Akathisia:  No  Handed:  Right  AIMS (if indicated): not done  Assets:  Communication Skills Desire for Improvement Financial  Resources/Insurance Housing Leisure Time Physical Health Social Support  ADL's:  Intact  Cognition: WNL  Sleep:  Good   Screenings: AIMS    Flowsheet Row Office Visit from 02/09/2022 in [facility] Office Visit from 10/06/2021 in [facility] Clinical Support from 02/08/2021 in [facility]  AIMS Total Score 0 0 0      GAD-7    Flowsheet Row Office Visit from 08/08/2022 in [facility] Office Visit from 05/11/2022 in [facility] Office Visit from 02/09/2022 in [facility] Office Visit from 10/06/2021 in [facility] Clinical Support from 02/08/2021 in [facility]  Total GAD-7 Score 2 2 0 0 0      PHQ2-9    Flowsheet Row Office Visit from 08/08/2022 in [facility] Office Visit from 05/11/2022 in [facility] Office Visit from 02/09/2022 in [facility] Office Visit from 10/06/2021 in [facility] Office Visit from 05/17/2021 in [facility]  PHQ-2 Total Score 0 0 0 0 0  PHQ-9 Total Score 4 1 -- -- --      Flowsheet Row ED from 09/27/2022 in [facility] at [facility] Visit from 02/09/2022 in [facility] Office Visit from 10/06/2021 in [facility]  C-SSRS RISK CATEGORY No Risk No Risk No Risk        Assessment and Plan: Patient notes that he is doing well on his current medication regimen. He does note that can cope with it. No medication changes made today. Patient agreeable to continue medications as prescribed.   1. Schizoaffective disorder, bipolar type (HCC)  Continue- ARIPiprazole ER (ABILIFY MAINTENA) 400 MG SRER injection; Inject 2 mLs (400 mg total) into  the muscle every 28 (twenty-eight) days.  Dispense: 1 each; Refill: 11  Collaboration of Care: Collaboration of Care: Other provider involved in patient's care [facility] Shot clinic and PCP  Patient/Guardian was advised Release of Information must be obtained prior to any record release in order to collaborate their care with an outside provider. Patient/Guardian was advised if they have not already done so to contact the registration department to sign all necessary forms in order for [place] to release information regarding their care.   Consent: Patient/Guardian gives verbal consent for treatment and assignment of benefits for services provided during this visit. Patient/Guardian expressed understanding and agreed to proceed.   Follow up in 3 months  Shanna Cisco, NP 11/14/2022, 10:36 AM

## 2022-11-14 NOTE — Progress Notes (Cosign Needed)
Patient arrived with his Abilify Maintena 400mg  from his pharmacy. Given in Right Deltoid without issues or complaints. States his medication is working well for him. Saw the nurse practitioner today for follow up. Denies SI/HI or AV hallucinations.

## 2022-12-14 ENCOUNTER — Encounter (HOSPITAL_COMMUNITY): Payer: Self-pay

## 2022-12-14 ENCOUNTER — Ambulatory Visit (INDEPENDENT_AMBULATORY_CARE_PROVIDER_SITE_OTHER): Payer: MEDICAID

## 2022-12-14 VITALS — BP 133/88 | HR 84 | Ht 70.0 in | Wt 154.0 lb

## 2022-12-14 DIAGNOSIS — G4700 Insomnia, unspecified: Secondary | ICD-10-CM

## 2022-12-14 DIAGNOSIS — F411 Generalized anxiety disorder: Secondary | ICD-10-CM

## 2022-12-14 DIAGNOSIS — F200 Paranoid schizophrenia: Secondary | ICD-10-CM

## 2022-12-14 MED ORDER — ARIPIPRAZOLE ER 400 MG IM PRSY
400.0000 mg | PREFILLED_SYRINGE | Freq: Once | INTRAMUSCULAR | Status: AC
  Administered 2022-12-14: 400 mg via INTRAMUSCULAR

## 2022-12-14 NOTE — Progress Notes (Cosign Needed)
Patient Arrived for Monthly ARIPiprazole ER (ABILIFY MAINTENA) 400 MG Injection Tolerated injection well in LEFT  Arm. Pleasant as Always NO HI/SI   NOR AH/VH

## 2023-01-11 ENCOUNTER — Ambulatory Visit (HOSPITAL_COMMUNITY): Payer: MEDICAID

## 2023-01-16 ENCOUNTER — Ambulatory Visit (INDEPENDENT_AMBULATORY_CARE_PROVIDER_SITE_OTHER): Payer: MEDICAID

## 2023-01-16 ENCOUNTER — Encounter (HOSPITAL_COMMUNITY): Payer: Self-pay

## 2023-01-16 VITALS — BP 150/89 | HR 89 | Ht 70.0 in | Wt 153.4 lb

## 2023-01-16 DIAGNOSIS — F411 Generalized anxiety disorder: Secondary | ICD-10-CM | POA: Diagnosis not present

## 2023-01-16 DIAGNOSIS — G4700 Insomnia, unspecified: Secondary | ICD-10-CM

## 2023-01-16 MED ORDER — ARIPIPRAZOLE ER 400 MG IM PRSY
400.0000 mg | PREFILLED_SYRINGE | Freq: Once | INTRAMUSCULAR | Status: AC
  Administered 2023-01-16: 400 mg via INTRAMUSCULAR

## 2023-01-16 NOTE — Progress Notes (Cosign Needed)
Patient Arrived for Monthly ARIPiprazole ER (ABILIFY MAINTENA) 400 MG Injection Tolerated injection well in RIGHT  Arm. Pleasant as Always NO HI/SI   NOR AH/VH

## 2023-02-08 ENCOUNTER — Ambulatory Visit (HOSPITAL_COMMUNITY): Payer: MEDICAID

## 2023-02-13 ENCOUNTER — Ambulatory Visit (INDEPENDENT_AMBULATORY_CARE_PROVIDER_SITE_OTHER): Payer: MEDICAID

## 2023-02-13 ENCOUNTER — Encounter (HOSPITAL_COMMUNITY): Payer: Self-pay

## 2023-02-13 ENCOUNTER — Ambulatory Visit (INDEPENDENT_AMBULATORY_CARE_PROVIDER_SITE_OTHER): Payer: MEDICAID | Admitting: Psychiatry

## 2023-02-13 DIAGNOSIS — F200 Paranoid schizophrenia: Secondary | ICD-10-CM

## 2023-02-13 DIAGNOSIS — G4700 Insomnia, unspecified: Secondary | ICD-10-CM

## 2023-02-13 DIAGNOSIS — F250 Schizoaffective disorder, bipolar type: Secondary | ICD-10-CM

## 2023-02-13 DIAGNOSIS — F411 Generalized anxiety disorder: Secondary | ICD-10-CM

## 2023-02-13 MED ORDER — ARIPIPRAZOLE ER 400 MG IM PRSY
400.0000 mg | PREFILLED_SYRINGE | Freq: Once | INTRAMUSCULAR | Status: AC
  Administered 2023-02-13: 400 mg via INTRAMUSCULAR

## 2023-02-13 MED ORDER — ARIPIPRAZOLE ER 400 MG IM SRER: 400.0000 mg | INTRAMUSCULAR | 11 refills | Status: DC

## 2023-02-13 NOTE — Progress Notes (Cosign Needed)
Patient Arrived for Monthly ARIPiprazole ER (ABILIFY MAINTENA) 400 MG Injection Tolerated injection well in RIGHT  Arm. Pleasant as Always NO HI/SI   NOR AH/VH

## 2023-02-13 NOTE — Progress Notes (Signed)
BH MD/PA/NP OP Progress Note  02/13/2023 8:33 AM Hector Wright  MRN:  [id number]  Chief Complaint: "I am doing okay"  HPI: 37 year old male seen today for follow up psychiatric evaluation. He has a psychiatric history of schizoaffective disorder bipolar type. He is currently managed on Abilify 400 mg monthly. He notes that his medications are effective in managing his psychiatric conditions.   Today he was well groom, pleasant, cooperative, and engaged in conversation. He informed Clinical research associate that he is doing okay. He notes his mood is stable and notes that he has minimal anxiety and depression. At times patient notes that he is concerned about his family as one of his uncles just passed. He notes that last week he heard his deceased uncles voice. He denies current hallucinations, VAH, mania, or paranoia.  Today provider conducted GAD-7 and patient scored a 3, at his last visit he scored a 2.  Provider also conducted PHQ-9 and patient scored a 8, at his last visit he scored a 1.  He endorses adequate sleep and appetite.    Provider conducted an aims assessment and patient scored a 0.  No medication changes made today. Patient agreeable to continue medications as prescribed. No other concerns noted at this time.    Visit Diagnosis:  No diagnosis found.   Past Psychiatric History:  schizoaffective disorder bipolar type  Past Medical History: No past medical history on file. No past surgical history on file.  Family Psychiatric History: None reported   Family History: No family history on file.  Social History:  Social History   Socioeconomic History   Marital status: Single    Spouse name: Not on file   Number of children: Not on file   Years of education: Not on file   Highest education level: Not on file  Occupational History   Not on file  Tobacco Use   Smoking status: Never   Smokeless tobacco: Never  Substance and Sexual Activity   Alcohol use: No   Drug use: Never   Sexual  activity: Not Currently  Other Topics Concern   Not on file  Social History Narrative   Not on file   Social Determinants of Health   Financial Resource Strain: Not on File (08/25/2021)   Received from [employer], [employer]    Financial Resource Strain: 0  Food Insecurity: Not on File (02/01/2023)   Received from [facility] Insecurity    Food: 0  Transportation Needs: Not on File (08/25/2021)   Received from [employer], [employer] Needs    Transportation: 0  Physical Activity: Not on File (08/25/2021)   Received from [place], [place]   Physical Activity    Physical Activity: 0  Stress: Not on File (08/25/2021)   Received from [facility], [place]   Stress    Stress: 0  Social Connections: Not on File (02/01/2023)   Received from [employer]   Social Connections    Connectedness: 0    Allergies: Not on File  Metabolic Disorder Labs: No results found for: "HGBA1C", "MPG" No results found for: "PROLACTIN" No results found for: "CHOL", "TRIG", "HDL", "CHOLHDL", "VLDL", "LDLCALC" No results found for: "TSH"  Therapeutic Level Labs: No results found for: "LITHIUM" No results found for: "VALPROATE" No results found for: "CBMZ"  Current Medications: Current Outpatient Medications  Medication Sig Dispense Refill   ARIPiprazole ER (ABILIFY MAINTENA) 400 MG SRER injection Inject 2 mLs (400 mg total) into the muscle  every 28 (twenty-eight) days. 1 each 11   No current facility-administered medications for this visit.     Musculoskeletal: Strength & Muscle Tone: within normal limits Gait & Station: normal Patient leans: N/A  Psychiatric Specialty Exam: Review of Systems  There were no vitals taken for this visit.There is no height or weight on file to calculate BMI.  General Appearance: Well Groomed  Eye Contact:  Good  Speech:  Clear and Coherent and Normal Rate  Volume:  Normal  Mood:  Euthymic  Affect:  Appropriate and Congruent  Thought Process:   Coherent, Goal Directed, and Linear  Orientation:  Full (Time, Place, and Person)  Thought Content: WDL and Logical   Suicidal Thoughts:  No  Homicidal Thoughts:  No  Memory:  Immediate;   Good Recent;   Good Remote;   Good  Judgement:  Good  Insight:  Good  Psychomotor Activity:  Normal  Concentration:  Concentration: Good and Attention Span: Good  Recall:  Good  Fund of Knowledge: Good  Language: Good  Akathisia:  No  Handed:  Right  AIMS (if indicated):  done, 0  Assets:  Communication Skills Desire for Improvement Financial Resources/Insurance Housing Leisure Time Physical Health Social Support  ADL's:  Intact  Cognition: WNL  Sleep:  Good   Screenings: Geneticist, molecular Office Visit from 11/14/2022 in [facility] Office Visit from 02/09/2022 in [facility] Office Visit from 10/06/2021 in [facility] Clinical Support from 02/08/2021 in [facility]  AIMS Total Score 0 0 0 0      GAD-7    Flowsheet Row Office Visit from 11/14/2022 in [facility] Office Visit from 08/08/2022 in [facility] Office Visit from 05/11/2022 in [facility] Office Visit from 02/09/2022 in [facility] Office Visit from 10/06/2021 in [facility]  Total GAD-7 Score 2 2 2  0 0      PHQ2-9    Flowsheet Row Office Visit from 11/14/2022 in [facility] Office Visit from 08/08/2022 in [facility] Office Visit from 05/11/2022 in [facility] Office Visit from 02/09/2022 in [facility] Office Visit from 10/06/2021 in [facility]  PHQ-2 Total Score 0 0 0 0 0  PHQ-9 Total Score 1 4 1  -- --      Flowsheet Row Office Visit  from 11/14/2022 in [facility] ED from 09/27/2022 in [facility] Visit from 02/09/2022 in [facility]  C-SSRS RISK CATEGORY No Risk No Risk No Risk        Assessment and Plan: Patient notes that he is doing well on his current medication regimen.  He reports that he had an auditory hallucinations of his deseaced uncles voice a week ago but denies current hallucinations.   No medication changes made today.  Patient agreeable to continue medication as prescribed.  1. Schizoaffective disorder, bipolar type (HCC)  Continue- ARIPiprazole ER (ABILIFY MAINTENA) 400 MG SRER injection; Inject 2 mLs (400 mg total) into the muscle every 28 (twenty-eight) days.  Dispense: 1 each; Refill: 11  Collaboration of Care: Collaboration of Care: Other provider involved in patient's care AEB Shot clinic and PCP  Patient/Guardian was advised Release of Information must be obtained prior to any  record release in order to collaborate their care with an outside provider. Patient/Guardian was advised if they have not already done so to contact the registration department to sign all necessary forms in order for [place] to release information regarding their care.   Consent: Patient/Guardian gives verbal consent for treatment and assignment of benefits for services provided during this visit. Patient/Guardian expressed understanding and agreed to proceed.   Follow up in 3 months  Shanna Cisco, NP 02/13/2023, 8:33 AM

## 2023-03-13 ENCOUNTER — Ambulatory Visit (HOSPITAL_COMMUNITY): Payer: MEDICAID

## 2023-03-13 ENCOUNTER — Encounter (HOSPITAL_COMMUNITY): Payer: Self-pay

## 2023-03-13 VITALS — BP 149/86 | HR 67 | Resp 100 | Ht 70.0 in | Wt 158.4 lb

## 2023-03-13 DIAGNOSIS — F411 Generalized anxiety disorder: Secondary | ICD-10-CM

## 2023-03-13 DIAGNOSIS — F200 Paranoid schizophrenia: Secondary | ICD-10-CM | POA: Diagnosis not present

## 2023-03-13 DIAGNOSIS — G4700 Insomnia, unspecified: Secondary | ICD-10-CM

## 2023-03-13 MED ORDER — ARIPIPRAZOLE ER 400 MG IM PRSY
400.0000 mg | PREFILLED_SYRINGE | Freq: Once | INTRAMUSCULAR | Status: AC
  Administered 2023-03-13: 400 mg via INTRAMUSCULAR

## 2023-03-13 NOTE — Progress Notes (Cosign Needed)
Patient Arrived for Monthly ARIPiprazole ER (ABILIFY MAINTENA) 400 MG Injection Tolerated injection well in LEFT  Arm. Pleasant as Always NO HI/SI   NOR AH/VH

## 2023-04-10 ENCOUNTER — Ambulatory Visit (HOSPITAL_COMMUNITY): Payer: MEDICAID

## 2023-04-11 ENCOUNTER — Encounter (HOSPITAL_COMMUNITY): Payer: Self-pay

## 2023-04-11 ENCOUNTER — Ambulatory Visit (HOSPITAL_COMMUNITY): Payer: MEDICAID

## 2023-04-11 ENCOUNTER — Ambulatory Visit (INDEPENDENT_AMBULATORY_CARE_PROVIDER_SITE_OTHER): Payer: MEDICAID

## 2023-04-11 VITALS — BP 132/78 | HR 72 | Resp 16 | Ht 70.0 in | Wt 147.8 lb

## 2023-04-11 DIAGNOSIS — F200 Paranoid schizophrenia: Secondary | ICD-10-CM | POA: Diagnosis not present

## 2023-04-11 MED ORDER — ARIPIPRAZOLE ER 400 MG IM PRSY
400.0000 mg | PREFILLED_SYRINGE | Freq: Once | INTRAMUSCULAR | Status: AC
Start: 2023-04-11 — End: 2023-04-11
  Administered 2023-04-11: 400 mg via INTRAMUSCULAR

## 2023-04-11 NOTE — Progress Notes (Cosign Needed)
Patient arrived for injection of Abilify 400mg  that he provided from his pharmacy. Given in his Right deltoid without issue or complaint. Denies SI/HI or AV hallucinations. States he has no side effects and medication is working as it should. Will return in 28 days.

## 2023-05-08 ENCOUNTER — Ambulatory Visit (HOSPITAL_COMMUNITY): Payer: MEDICAID

## 2023-05-10 ENCOUNTER — Ambulatory Visit (HOSPITAL_COMMUNITY): Payer: MEDICAID

## 2023-05-15 ENCOUNTER — Ambulatory Visit (HOSPITAL_COMMUNITY): Payer: MEDICAID

## 2023-05-17 ENCOUNTER — Ambulatory Visit (INDEPENDENT_AMBULATORY_CARE_PROVIDER_SITE_OTHER): Payer: MEDICAID | Admitting: Psychiatry

## 2023-05-17 ENCOUNTER — Encounter (HOSPITAL_COMMUNITY): Payer: Self-pay | Admitting: Psychiatry

## 2023-05-17 ENCOUNTER — Encounter (HOSPITAL_COMMUNITY): Payer: Self-pay

## 2023-05-17 ENCOUNTER — Ambulatory Visit (INDEPENDENT_AMBULATORY_CARE_PROVIDER_SITE_OTHER): Payer: MEDICAID

## 2023-05-17 VITALS — BP 145/78 | HR 82 | Wt 148.4 lb

## 2023-05-17 DIAGNOSIS — F200 Paranoid schizophrenia: Secondary | ICD-10-CM

## 2023-05-17 DIAGNOSIS — F411 Generalized anxiety disorder: Secondary | ICD-10-CM

## 2023-05-17 DIAGNOSIS — F250 Schizoaffective disorder, bipolar type: Secondary | ICD-10-CM

## 2023-05-17 DIAGNOSIS — G4700 Insomnia, unspecified: Secondary | ICD-10-CM

## 2023-05-17 MED ORDER — ARIPIPRAZOLE ER 400 MG IM SRER: 400.0000 mg | INTRAMUSCULAR | 11 refills | Status: DC

## 2023-05-17 MED ORDER — ARIPIPRAZOLE ER 400 MG IM PRSY
400.0000 mg | PREFILLED_SYRINGE | Freq: Once | INTRAMUSCULAR | Status: AC
  Administered 2023-05-17: 400 mg via INTRAMUSCULAR

## 2023-05-17 NOTE — Progress Notes (Cosign Needed)
Patient Arrived for Monthly ARIPiprazole ER (ABILIFY MAINTENA) 400 MG Injection Tolerated injection well in LEFT  Arm. Pleasant as Always NO HI/SI   NOR AH/VH

## 2023-05-17 NOTE — Progress Notes (Addendum)
 BH MD/PA/NP OP Progress Note  05/17/2023 10:16 AM Hector Wright  MRN:  [id number]  Chief Complaint: I am chilling  HPI: 38 year old male seen today for follow up psychiatric evaluation. He has a psychiatric history of schizoaffective disorder bipolar type. He is currently managed on Abilify  400 mg monthly. He notes that his medications are effective in managing his psychiatric conditions.   Today he was well groom, pleasant, cooperative, and engaged in conversation. He informed clinical research associate that he has been chilling. He notes that his mood continues to be stable and notes that he has minimal anxiety and depression. Today provider conducted GAD-7 and patient scored a 1, at his last visit he scored a 3.  Provider also conducted PHQ-9 and patient scored a 1, at his last visit he scored a 8.  He endorses adequate sleep and appetite.  He denies SI/HI/VAH, mania or paranoia.   Provider conducted an aims assessment and patient scored a 0.  Patient notes that in his free time he spends time with his family and friends.   No medication changes made today. Patient agreeable to continue medications as prescribed. Patient has no recent labs. Today provider ordered CBC, CMP, Lipid panel, HGB A1c, LFT, and Thyroid panel. No other concerns noted at this time.    Visit Diagnosis:    ICD-10-CM   1. Schizoaffective disorder, bipolar type (HCC)  F25.0 ARIPiprazole  ER (ABILIFY  MAINTENA) 400 MG SRER injection    CBC w/Diff/Platelet    Comprehensive Metabolic Panel (CMET)    Prolactin    Lipid Profile    HgB A1c    Hepatic function panel    Thyroid Panel With TSH       Past Psychiatric History:  schizoaffective disorder bipolar type  Past Medical History: No past medical history on file. No past surgical history on file.  Family Psychiatric History: None reported   Family History: No family history on file.  Social History:  Social History   Socioeconomic History   Marital status: Single    Spouse  name: Not on file   Number of children: Not on file   Years of education: Not on file   Highest education level: Not on file  Occupational History   Not on file  Tobacco Use   Smoking status: Never   Smokeless tobacco: Never  Substance and Sexual Activity   Alcohol use: No   Drug use: Never   Sexual activity: Not Currently  Other Topics Concern   Not on file  Social History Narrative   Not on file   Social Drivers of Health   Financial Resource Strain: Not on File (08/25/2021)   Received from [employer] [employer]    Financial Resource Strain: 0  Food Insecurity: Not on File (02/01/2023)   Received from [facility] Insecurity    Food: 0  Transportation Needs: Not on File (08/25/2021)   Received from [employer], [employer] Needs    Transportation: 0  Physical Activity: Not on File (08/25/2021)   Received from [place], [place]   Physical Activity    Physical Activity: 0  Stress: Not on File (08/25/2021)   Received from [facility], [place]   Stress    Stress: 0  Social Connections: Not on File (02/01/2023)   Received from [facility]   Social Connections    Connectedness: 0    Allergies: Not on File  Metabolic Disorder Labs: No results found for: HGBA1C, MPG No results found  for: PROLACTIN No results found for: CHOL, TRIG, HDL, CHOLHDL, VLDL, LDLCALC No results found for: TSH  Therapeutic Level Labs: No results found for: LITHIUM No results found for: VALPROATE No results found for: CBMZ  Current Medications: Current Outpatient Medications  Medication Sig Dispense Refill   ARIPiprazole  ER (ABILIFY  MAINTENA) 400 MG SRER injection Inject 2 mLs (400 mg total) into the muscle every 28 (twenty-eight) days. 1 each 11   No current facility-administered medications for this visit.     Musculoskeletal: Strength & Muscle Tone: within normal limits Gait & Station: normal Patient leans: N/A  Psychiatric Specialty Exam: Review of  Systems  There were no vitals taken for this visit.There is no height or weight on file to calculate BMI.  General Appearance: Well Groomed  Eye Contact:  Good  Speech:  Clear and Coherent and Normal Rate  Volume:  Normal  Mood:  Euthymic  Affect:  Appropriate and Congruent  Thought Process:  Coherent, Goal Directed, and Linear  Orientation:  Full (Time, Place, and Person)  Thought Content: WDL and Logical   Suicidal Thoughts:  No  Homicidal Thoughts:  No  Memory:  Immediate;   Good Recent;   Good Remote;   Good  Judgement:  Good  Insight:  Good  Psychomotor Activity:  Normal  Concentration:  Concentration: Good and Attention Span: Good  Recall:  Good  Fund of Knowledge: Good  Language: Good  Akathisia:  No  Handed:  Right  AIMS (if indicated):  done, 0  Assets:  Communication Skills Desire for Improvement Financial Resources/Insurance Housing Leisure Time Physical Health Social Support  ADL's:  Intact  Cognition: WNL  Sleep:  Good   Screenings: AIMS    Flowsheet Row Clinical Support from 05/17/2023 in [facility] Office Visit from 02/13/2023 in [facility] Office Visit from 11/14/2022 in [facility] Office Visit from 02/09/2022 in [facility] Office Visit from 10/06/2021 in [facility]  AIMS Total Score 0 0 0 0 0      GAD-7    Flowsheet Row Clinical Support from 05/17/2023 in [facility] Office Visit from 02/13/2023 in [facility] Office Visit from 11/14/2022 in [facility] Office Visit from 08/08/2022 in [facility] Office Visit from 05/11/2022 in [facility]  Total GAD-7 Score 1 3 2 2 2       PHQ2-9    Flowsheet Row Clinical Support from 05/17/2023 in [facility]  Office Visit from 02/13/2023 in [facility] Office Visit from 11/14/2022 in [facility] Office Visit from 08/08/2022 in [facility] Office Visit from 05/11/2022 in [facility]  PHQ-2 Total Score 0 2 0 0 0  PHQ-9 Total Score 1 8 1 4 1       Flowsheet Row Clinical Support from 05/17/2023 in [facility] Office Visit from 02/13/2023 in [facility] Office Visit from 11/14/2022 in [facility]  C-SSRS RISK CATEGORY No Risk No Risk No Risk        Assessment and Plan: Patient notes that he is doing well on his current medication regimen. No medication changes made today.  Patient agreeable to continue medication as prescribed.Patient has no recent labs. Today provider ordered CBC, CMP, Lipid panel, HGB A1c,  LFT, and Thyroid panel.  1. Schizoaffective disorder, bipolar type (HCC)  Continue- ARIPiprazole  ER (ABILIFY  MAINTENA) 400 MG SRER injection; Inject 2 mLs (400 mg total) into the muscle every 28 (twenty-eight) days.  Dispense: 1 each; Refill: 11 - CBC w/Diff/Platelet; Future - Comprehensive Metabolic Panel (CMET); Future - Prolactin; Future - Lipid Profile; Future - HgB A1c; Future - Hepatic function panel; Future - Thyroid Panel With TSH; Future   Collaboration of Care: Collaboration of Care: Other provider involved in patient's care AEB Shot clinic and PCP  Patient/Guardian was advised Release of Information must be obtained prior to any record release in order to collaborate their care with an outside provider. Patient/Guardian was advised if they have not already done so to contact the registration department to sign all necessary forms in order for us  to release information regarding their care.   Consent: Patient/Guardian gives verbal consent for treatment and assignment of benefits for services  provided during this visit. Patient/Guardian expressed understanding and agreed to proceed.   Follow up in 3 months  Zane FORBES Bach, NP 05/17/2023, 10:16 AM

## 2023-05-17 NOTE — Addendum Note (Signed)
 Addended by: Shanna Cisco on: 05/17/2023 10:18 AM   Modules accepted: Orders

## 2023-05-21 ENCOUNTER — Other Ambulatory Visit (HOSPITAL_COMMUNITY): Payer: MEDICAID

## 2023-05-28 ENCOUNTER — Other Ambulatory Visit (HOSPITAL_COMMUNITY): Payer: MEDICAID

## 2023-05-30 ENCOUNTER — Other Ambulatory Visit (HOSPITAL_COMMUNITY): Payer: MEDICAID

## 2023-06-04 ENCOUNTER — Other Ambulatory Visit: Payer: Self-pay | Admitting: Psychiatry

## 2023-06-04 ENCOUNTER — Other Ambulatory Visit (INDEPENDENT_AMBULATORY_CARE_PROVIDER_SITE_OTHER): Payer: MEDICAID

## 2023-06-04 DIAGNOSIS — F250 Schizoaffective disorder, bipolar type: Secondary | ICD-10-CM | POA: Diagnosis not present

## 2023-06-04 NOTE — Progress Notes (Signed)
Patient tolerated labs well, provider will notify once labs are resulted

## 2023-06-05 LAB — CBC WITH DIFFERENTIAL/PLATELET
Basophils Absolute: 0.1 10*3/uL (ref 0.0–0.2)
Basos: 2 %
EOS (ABSOLUTE): 0 10*3/uL (ref 0.0–0.4)
Eos: 1 %
Hematocrit: 42.3 % (ref 37.5–51.0)
Hemoglobin: 13.5 g/dL (ref 13.0–17.7)
Immature Grans (Abs): 0 10*3/uL (ref 0.0–0.1)
Immature Granulocytes: 0 %
Lymphocytes Absolute: 1.6 10*3/uL (ref 0.7–3.1)
Lymphs: 49 %
MCH: 29 pg (ref 26.6–33.0)
MCHC: 31.9 g/dL (ref 31.5–35.7)
MCV: 91 fL (ref 79–97)
Monocytes Absolute: 0.2 10*3/uL (ref 0.1–0.9)
Monocytes: 7 %
Neutrophils Absolute: 1.4 10*3/uL (ref 1.4–7.0)
Neutrophils: 41 %
Platelets: 291 10*3/uL (ref 150–450)
RBC: 4.66 x10E6/uL (ref 4.14–5.80)
RDW: 12 % (ref 11.6–15.4)
WBC: 3.3 10*3/uL — ABNORMAL LOW (ref 3.4–10.8)

## 2023-06-05 LAB — COMPREHENSIVE METABOLIC PANEL
ALT: 24 [IU]/L (ref 0–44)
AST: 27 [IU]/L (ref 0–40)
Albumin: 4.8 g/dL (ref 4.1–5.1)
Alkaline Phosphatase: 82 [IU]/L (ref 44–121)
BUN/Creatinine Ratio: 11 (ref 9–20)
BUN: 13 mg/dL (ref 6–20)
Bilirubin Total: 1 mg/dL (ref 0.0–1.2)
CO2: 18 mmol/L — ABNORMAL LOW (ref 20–29)
Calcium: 9.9 mg/dL (ref 8.7–10.2)
Chloride: 104 mmol/L (ref 96–106)
Creatinine, Ser: 1.22 mg/dL (ref 0.76–1.27)
Globulin, Total: 3.2 g/dL (ref 1.5–4.5)
Glucose: 91 mg/dL (ref 70–99)
Potassium: 4.1 mmol/L (ref 3.5–5.2)
Sodium: 142 mmol/L (ref 134–144)
Total Protein: 8 g/dL (ref 6.0–8.5)
eGFR: 79 mL/min/{1.73_m2} (ref >59)

## 2023-06-05 LAB — SPECIMEN STATUS REPORT

## 2023-06-05 LAB — PROLACTIN: Prolactin: 0.3 ng/mL — ABNORMAL LOW (ref 3.9–22.7)

## 2023-06-05 NOTE — Progress Notes (Signed)
Provider spoke to patient about labs. No other concerns noted at this time

## 2023-06-14 ENCOUNTER — Ambulatory Visit (HOSPITAL_COMMUNITY): Payer: MEDICAID

## 2023-06-14 ENCOUNTER — Other Ambulatory Visit (HOSPITAL_COMMUNITY): Payer: Self-pay | Admitting: Psychiatry

## 2023-06-14 ENCOUNTER — Encounter (HOSPITAL_COMMUNITY): Payer: Self-pay

## 2023-06-14 ENCOUNTER — Ambulatory Visit (INDEPENDENT_AMBULATORY_CARE_PROVIDER_SITE_OTHER): Payer: MEDICAID

## 2023-06-14 VITALS — BP 144/95 | HR 82 | Ht 70.0 in | Wt 139.0 lb

## 2023-06-14 DIAGNOSIS — F411 Generalized anxiety disorder: Secondary | ICD-10-CM

## 2023-06-14 DIAGNOSIS — F250 Schizoaffective disorder, bipolar type: Secondary | ICD-10-CM | POA: Diagnosis not present

## 2023-06-14 DIAGNOSIS — Z0000 Encounter for general adult medical examination without abnormal findings: Secondary | ICD-10-CM

## 2023-06-14 DIAGNOSIS — F200 Paranoid schizophrenia: Secondary | ICD-10-CM

## 2023-06-14 MED ORDER — ARIPIPRAZOLE ER 400 MG IM SRER: 400.0000 mg | INTRAMUSCULAR | 11 refills | Status: DC

## 2023-06-14 MED ORDER — ARIPIPRAZOLE ER 400 MG IM PRSY
400.0000 mg | PREFILLED_SYRINGE | Freq: Once | INTRAMUSCULAR | Status: AC
  Administered 2023-06-14: 400 mg via INTRAMUSCULAR

## 2023-06-14 NOTE — Progress Notes (Cosign Needed)
 Pt presents today for injection of Abillify Maintenna, which was administered in Right deltoid, injection was tolerated well. Pts blood pressure was high all three times taken, Pt was refer to PCP from Dr.Parsons.  Pt agreed to receive his injection.  PT states he is feeling good.

## 2023-06-14 NOTE — Telephone Encounter (Signed)
 Patients blood pressure continues to rise at each visit. On 05/17/2023 patients blood pressure 145/78. On 06/14/2023 his blood pressure was 144/95. It was taken a second time on the opposite arm and was 164/96. Patient does not have a PCP. Provider recommends a wellness check and further examination of his blood pressure. Patient also notified that his LAI will potentially elevate his blood pressure. Patient notes that he still wants to have it done.

## 2023-07-12 ENCOUNTER — Ambulatory Visit (HOSPITAL_COMMUNITY): Payer: MEDICAID

## 2023-07-20 ENCOUNTER — Ambulatory Visit: Payer: MEDICAID | Admitting: Family Medicine

## 2023-07-25 ENCOUNTER — Telehealth (HOSPITAL_COMMUNITY): Payer: Self-pay | Admitting: *Deleted

## 2023-07-25 NOTE — Telephone Encounter (Signed)
 Following up with him to reschedule his inj as he missed his last scheduled appt on 07/11/23. Sounded like I woke him up when I called him at 130, states he did not come to his last appt because he got a letter telling him not to come here. Clarifed with him he is still expected to come here and scheduled him for Tues.

## 2023-07-31 ENCOUNTER — Ambulatory Visit (INDEPENDENT_AMBULATORY_CARE_PROVIDER_SITE_OTHER): Payer: MEDICAID

## 2023-07-31 ENCOUNTER — Ambulatory Visit (INDEPENDENT_AMBULATORY_CARE_PROVIDER_SITE_OTHER): Payer: MEDICAID | Admitting: Psychiatry

## 2023-07-31 ENCOUNTER — Encounter (HOSPITAL_COMMUNITY): Payer: Self-pay

## 2023-07-31 VITALS — BP 150/79 | HR 80 | Temp 97.3°F | Ht 70.0 in | Wt 140.0 lb

## 2023-07-31 DIAGNOSIS — F250 Schizoaffective disorder, bipolar type: Secondary | ICD-10-CM

## 2023-07-31 MED ORDER — ARIPIPRAZOLE ER 400 MG IM SRER
400.0000 mg | Freq: Once | INTRAMUSCULAR | Status: AC
  Administered 2023-07-31: 400 mg via INTRAMUSCULAR

## 2023-07-31 MED ORDER — ARIPIPRAZOLE ER 400 MG IM SRER: 400.0000 mg | INTRAMUSCULAR | 11 refills | Status: AC

## 2023-07-31 NOTE — Progress Notes (Cosign Needed)
 Today Pt presents to be calm and collective and a joy. He is a very exited Herbalist. Pt stats that he is looking forward to march madness this month.    Pt tolerated Abilify maintenna in left deltoid with no complaints.   Pt denies all avh, SI, and HI. Pt states that he is doing good overall.Pt has no complaints for provider at this moment.

## 2023-07-31 NOTE — Progress Notes (Signed)
 BH MD/PA/NP OP Progress Note  07/31/2023 8:30 AM Hector Wright  MRN:  [id number] Chief Complaint: "I have been exercising"  HPI: 38 year old male seen today for follow up psychiatric evaluation. He has a psychiatric history of schizoaffective disorder bipolar type. He is managed on Abilify 400 mg monthly. He notes that his medications are effective in managing his psychiatric conditions.   Today he was well groom, pleasant, cooperative, and engaged in conversation. He informed Clinical research associate that he has been exercising and feeling mentally stable. Patient notes that his mood is stable and notes that he has minimal anxiety and depression. Today provider conducted a GAD 7 and patient scored a 0, at his last visit he scored a 1.  Provider also conducted PHQ-9 and patient scored a 1, at his last visit he scored a 1.  He endorses adequate sleep and appetite.  He denies SI/HI/VAH, mania or paranoia.  Patient notes that he plays video games in his free time. He currently is unemployed and on disability.    No medication changes made today. Patient agreeable to continue medications as prescribed.  No other concerns noted at this time.    Visit Diagnosis:  No diagnosis found.    Past Psychiatric History:  schizoaffective disorder bipolar type  Past Medical History: No past medical history on file. No past surgical history on file.  Family Psychiatric History: None reported   Family History: No family history on file.  Social History:  Social History   Socioeconomic History   Marital status: Single    Spouse name: Not on file   Number of children: Not on file   Years of education: Not on file   Highest education level: Not on file  Occupational History   Not on file  Tobacco Use   Smoking status: Never   Smokeless tobacco: Never  Substance and Sexual Activity   Alcohol use: No   Drug use: Never   Sexual activity: Not Currently  Other Topics Concern   Not on file  Social History Narrative    Not on file   Social Drivers of Health   Financial Resource Strain: Not on File (08/25/2021)   Received from [employer], [employer]    Financial Resource Strain: 0  Food Insecurity: Not on File (02/01/2023)   Received from [facility] Insecurity    Food: 0  Transportation Needs: Not on File (08/25/2021)   Received from [employer], [employer] Needs    Transportation: 0  Physical Activity: Not on File (08/25/2021)   Received from [place], [place]   Physical Activity    Physical Activity: 0  Stress: Not on File (08/25/2021)   Received from [facility], [place]   Stress    Stress: 0  Social Connections: Not on File (02/01/2023)   Received from [facility]   Social Connections    Connectedness: 0    Allergies: Not on File  Metabolic Disorder Labs: No results found for: "HGBA1C", "MPG" Lab Results  Component Value Date   PROLACTIN 0.3 (L) 06/04/2023   No results found for: "CHOL", "TRIG", "HDL", "CHOLHDL", "VLDL", "LDLCALC" No results found for: "TSH"  Therapeutic Level Labs: No results found for: "LITHIUM" No results found for: "VALPROATE" No results found for: "CBMZ"  Current Medications: Current Outpatient Medications  Medication Sig Dispense Refill   ARIPiprazole ER (ABILIFY MAINTENA) 400 MG SRER injection Inject 2 mLs (400 mg total) into the muscle every 28 (twenty-eight) days. 1 each 11  No current facility-administered medications for this visit.     Musculoskeletal: Strength & Muscle Tone: within normal limits Gait & Station: normal Patient leans: N/A  Psychiatric Specialty Exam: Review of Systems  There were no vitals taken for this visit.There is no height or weight on file to calculate BMI.  General Appearance: Well Groomed  Eye Contact:  Good  Speech:  Clear and Coherent and Normal Rate  Volume:  Normal  Mood:  Euthymic  Affect:  Appropriate and Congruent  Thought Process:  Coherent, Goal Directed, and Linear  Orientation:  Full (Time,  Place, and Person)  Thought Content: WDL and Logical   Suicidal Thoughts:  No  Homicidal Thoughts:  No  Memory:  Immediate;   Good Recent;   Good Remote;   Good  Judgement:  Good  Insight:  Good  Psychomotor Activity:  Normal  Concentration:  Concentration: Good and Attention Span: Good  Recall:  Good  Fund of Knowledge: Good  Language: Good  Akathisia:  No  Handed:  Right  AIMS (if indicated):  done, 0  Assets:  Communication Skills Desire for Improvement Financial Resources/Insurance Housing Leisure Time Physical Health Social Support  ADL's:  Intact  Cognition: WNL  Sleep:  Good   Screenings: AIMS    Flowsheet Row Clinical Support from 05/17/2023 in [facility] Office Visit from 02/13/2023 in [facility] Office Visit from 11/14/2022 in [facility] Office Visit from 02/09/2022 in [facility] Office Visit from 10/06/2021 in [facility]  AIMS Total Score 0 0 0 0 0      GAD-7    Flowsheet Row Clinical Support from 05/17/2023 in [facility] Office Visit from 02/13/2023 in [facility] Office Visit from 11/14/2022 in [facility] Office Visit from 08/08/2022 in [facility] Office Visit from 05/11/2022 in [facility]  Total GAD-7 Score 1 3 2 2 2       PHQ2-9    Flowsheet Row Clinical Support from 05/17/2023 in [facility] Office Visit from 02/13/2023 in [facility] Office Visit from 11/14/2022 in [facility] Office Visit from 08/08/2022 in [facility] Office Visit from 05/11/2022 in [facility]  PHQ-2 Total Score 0 2 0 0 0  PHQ-9 Total Score 1 8 1 4 1       Flowsheet Row  Clinical Support [facility]17/2023 in [facility] Office Visit from 02/13/2023 in [facility] Office Visit from 11/14/2022 in [facility]  C-SSRS RISK CATEGORY No Risk No Risk No Risk        Assessment and Plan: Patient notes that he is doing well on his current medication regimen. No medication changes made today.  Patient agreeable to continue medication as prescribed.Patient has no recent labs. Today provider ordered CBC, CMP, Lipid panel, HGB A1c, LFT, and Thyroid panel.  1. Schizoaffective disorder, bipolar type (HCC)  Continue- ARIPiprazole ER (ABILIFY MAINTENA) 400 MG SRER injection; Inject 2 mLs (400 mg total) into the muscle every 28 (twenty-eight) days.  Dispense: 1 each; Refill: 11 - CBC w/Diff/Platelet; Future - Comprehensive Metabolic Panel (CMET); Future - Prolactin; Future - Lipid Profile; Future - HgB A1c; Future - Hepatic function panel; Future - Thyroid Panel With TSH; Future   Collaboration  of Care: Collaboration of Care: Other provider involved in patient's care AEB Shot clinic and PCP  Patient/Guardian was advised Release of Information must be obtained prior to any record release in order to collaborate their care with an outside provider. Patient/Guardian was advised if they have not already done so to contact the registration department to sign all necessary forms in order for [place] to release information regarding their care.   Consent: Patient/Guardian gives verbal consent for treatment and assignment of benefits for services provided during this visit. Patient/Guardian expressed understanding and agreed to proceed.   Follow up in 3 months  Shanna Cisco, NP 07/31/2023, 8:30 AM

## 2023-08-08 ENCOUNTER — Other Ambulatory Visit (HOSPITAL_COMMUNITY): Payer: Self-pay | Admitting: Psychiatry

## 2023-08-08 DIAGNOSIS — F250 Schizoaffective disorder, bipolar type: Secondary | ICD-10-CM

## 2023-08-09 ENCOUNTER — Ambulatory Visit (HOSPITAL_COMMUNITY): Payer: MEDICAID

## 2023-08-28 ENCOUNTER — Encounter (HOSPITAL_COMMUNITY): Payer: Self-pay

## 2023-08-28 ENCOUNTER — Ambulatory Visit (INDEPENDENT_AMBULATORY_CARE_PROVIDER_SITE_OTHER): Payer: MEDICAID

## 2023-08-28 VITALS — BP 145/78 | HR 82 | Wt 141.1 lb

## 2023-08-28 DIAGNOSIS — F200 Paranoid schizophrenia: Secondary | ICD-10-CM

## 2023-08-28 DIAGNOSIS — F411 Generalized anxiety disorder: Secondary | ICD-10-CM | POA: Diagnosis not present

## 2023-08-28 DIAGNOSIS — G4700 Insomnia, unspecified: Secondary | ICD-10-CM

## 2023-08-28 MED ORDER — ARIPIPRAZOLE ER 400 MG IM PRSY
400.0000 mg | PREFILLED_SYRINGE | Freq: Once | INTRAMUSCULAR | Status: AC
Start: 2023-08-28 — End: 2023-08-28
  Administered 2023-08-28: 400 mg via INTRAMUSCULAR

## 2023-08-28 NOTE — Progress Notes (Cosign Needed)
Patient Arrived for Monthly ARIPiprazole ER (ABILIFY MAINTENA) 400 MG Injection Tolerated injection well in RIGHT  Arm. Pleasant as Always NO HI/SI   NOR AH/VH

## 2023-09-08 LAB — LAB REPORT - SCANNED
EGFR: 78
Free T4: 1.18
TSH: 0.87 (ref 0.41–5.90)

## 2023-09-25 ENCOUNTER — Ambulatory Visit (HOSPITAL_COMMUNITY): Payer: MEDICAID

## 2023-09-25 ENCOUNTER — Encounter (HOSPITAL_COMMUNITY): Payer: Self-pay | Admitting: Psychiatry

## 2023-09-25 ENCOUNTER — Ambulatory Visit (INDEPENDENT_AMBULATORY_CARE_PROVIDER_SITE_OTHER): Payer: MEDICAID | Admitting: Psychiatry

## 2023-09-25 VITALS — BP 133/92 | HR 78 | Ht 70.0 in | Wt 139.0 lb

## 2023-09-25 DIAGNOSIS — F250 Schizoaffective disorder, bipolar type: Secondary | ICD-10-CM

## 2023-09-25 MED ORDER — ARIPIPRAZOLE ER 400 MG IM PRSY
400.0000 mg | PREFILLED_SYRINGE | Freq: Once | INTRAMUSCULAR | Status: AC
  Administered 2023-09-25: 400 mg via INTRAMUSCULAR

## 2023-09-25 MED ORDER — ABILIFY MAINTENA 400 MG IM PRSY: 400.0000 mg | PREFILLED_SYRINGE | INTRAMUSCULAR | 11 refills | Status: DC

## 2023-09-25 NOTE — Progress Notes (Signed)
 BH MD/PA/NP OP Progress Note  09/25/2023 8:33 AM LEVAUGHN PUCCINELLI  MRN:  [id number] Chief Complaint: "I have been doing okay"  HPI: 38 year old male seen today for follow up psychiatric evaluation. He has a psychiatric history of schizoaffective disorder bipolar type. He is managed on Abilify  400 mg monthly. He notes that his medications are effective in managing his psychiatric conditions.   Today he was well groom, pleasant, cooperative, and engaged in conversation. He informed Clinical research associate that he has been doing okay.  He notes that a couple weeks ago he was little down because his grandmother had pneumonia.  He now notes that she is doing well.  Patient notes that he has been trying to stay active by exercising.  He notes that his mood is stable and reports that he has minimal anxiety and depression.  Today provider conducted a GAD 7 and patient scored a 3, at his last visit he scored a 0.  Provider also conducted PHQ-9 and patient scored a 1, at his last visit he scored a 1.  He endorses adequate sleep and appetite.  He denies SI/HI/VAH, mania or paranoia.    No medication changes made today. Patient agreeable to continue medications as prescribed.  No other concerns noted at this time.    Visit Diagnosis:    ICD-10-CM   1. Schizoaffective disorder, bipolar type (HCC)  F25.0 ARIPiprazole  ER (ABILIFY  MAINTENA) 400 MG PRSY prefilled syringe        Past Psychiatric History:  schizoaffective disorder bipolar type  Past Medical History: No past medical history on file. No past surgical history on file.  Family Psychiatric History: None reported   Family History: No family history on file.  Social History:  Social History   Socioeconomic History   Marital status: Single    Spouse name: Not on file   Number of children: Not on file   Years of education: Not on file   Highest education level: Not on file  Occupational History   Not on file  Tobacco Use   Smoking status: Never   Smokeless  tobacco: Never  Substance and Sexual Activity   Alcohol use: No   Drug use: Never   Sexual activity: Not Currently  Other Topics Concern   Not on file  Social History Narrative   Not on file   Social Drivers of Health   Financial Resource Strain: Not on File (08/25/2021)   Received from [employer], [employer]    Financial Resource Strain: 0  Food Insecurity: Not on File (02/01/2023)   Received from [facility] In[facility]    Food: 0  Transportation Needs: Not on File (08/25/2021)   Received from [place], [employer] Needs    Transportation: 0  Physical Activity: Not on File (08/25/2021)   Received from [place], [place]   Physical Activity    Physical Activity: 0  Stress: Not on File (08/25/2021)   Received from [facility], [place]   Stress    Stress: 0  Social Connections: Not on File (02/01/2023)   Received from [facility]   Social Connections    Connectedness: 0    Allergies: Not on File  Metabolic Disorder Labs: No results found for: "HGBA1C", "MPG" Lab Results  Component Value Date   PROLACTIN 0.3 (L) 06/04/2023   No results found for: "CHOL", "TRIG", "HDL", "CHOLHDL", "VLDL", "LDLCALC" No results found for: "TSH"  Therapeutic Level Labs: No results found for: "LITHIUM" No results found for: "  VALPROATE" No results found for: "CBMZ"  Current Medications: Current Outpatient Medications  Medication Sig Dispense Refill   ARIPiprazole  ER (ABILIFY  MAINTENA) 400 MG PRSY prefilled syringe Inject 400 mg into the muscle every 28 (twenty-eight) days. 1 each 11   ARIPiprazole  ER (ABILIFY  MAINTENA) 400 MG SRER injection Inject 2 mLs (400 mg total) into the muscle every 28 (twenty-eight) days. 1 each 11   Current Facility-Administered Medications  Medication Dose Route Frequency Provider Last Rate Last Admin   ARIPiprazole  ER (ABILIFY  MAINTENA) 400 MG prefilled syringe 400 mg  400 mg Intramuscular Once Arlyne Bering, NP          Musculoskeletal: Strength & Muscle Tone: within normal limits Gait & Station: normal Patient leans: N/A  Psychiatric Specialty Exam: Review of Systems  There were no vitals taken for this visit.There is no height or weight on file to calculate BMI.  General Appearance: Well Groomed  Eye Contact:  Good  Speech:  Clear and Coherent and Normal Rate  Volume:  Normal  Mood:  Euthymic  Affect:  Appropriate and Congruent  Thought Process:  Coherent, Goal Directed, and Linear  Orientation:  Full (Time, Place, and Person)  Thought Content: WDL and Logical   Suicidal Thoughts:  No  Homicidal Thoughts:  No  Memory:  Immediate;   Good Recent;   Good Remote;   Good  Judgement:  Good  Insight:  Good  Psychomotor Activity:  Normal  Concentration:  Concentration: Good and Attention Span: Good  Recall:  Good  Fund of Knowledge: Good  Language: Good  Akathisia:  No  Handed:  Right  AIMS (if indicated):  done, 0  Assets:  Communication Skills Desire for Improvement Financial Resources/Insurance Housing Leisure Time Physical Health Social Support  ADL's:  Intact  Cognition: WNL  Sleep:  Good   Screenings: AIMS    Flowsheet Row Clinical Support from 05/17/2023 in [facility] Office Visit from 02/13/2023 in [facility] Office Visit from 11/14/2022 in [facility] Office Visit from 02/09/2022 in [facility] Office Visit from 10/06/2021 in [facility]  AIMS Total Score 0 0 0 0 0      GAD-7    Flowsheet Row Office Visit from 09/25/2023 in [facility] Office Visit from 07/31/2023 in [facility] Clinical Support from 05/17/2023 in [facility] Office Visit from 02/13/2023 in [facility] Office Visit from 11/14/2022 in [facility]  Total GAD-7 Score 3 0 1 3 2       PHQ2-9    Flowsheet Row Office Visit from 09/25/2023 in [facility] Office Visit from 07/31/2023 in [facility] Clinical Support from 05/17/2023 in [place] Office Visit from 02/13/2023 in [facility] Office Visit from 11/14/2022 in [facility]  PHQ-2 Total Score 1 0 0 2 0  PHQ-9 Total Score 1 1 1 8 1       Flowsheet Row Office Visit from 09/25/2023 in [facility] Office Visit from 07/31/2023 in [facility] Clinical Support from 05/17/2023 in [facility]  C-SSRS RISK CATEGORY No Risk No Risk No Risk        Assessment and Plan: Patient notes that he is doing well on his current medication regimen. No  medication changes made today.  Patient agreeable to continue medication as prescribed.Patient has no recent labs. Today provider ordered CBC, CMP, Lipid panel, HGB A1c, LFT, and Thyroid panel.  1. Schizoaffective disorder, bipolar type (HCC)  Continue- ARIPiprazole  ER (ABILIFY  MAINTENA) 400 MG SRER injection; Inject 2 mLs (400 mg total) into the muscle every 28 (twenty-eight) days.  Dispense: 1 each; Refill: 11 - CBC w/Diff/Platelet; Future - Comprehensive Metabolic Panel (CMET); Future - Prolactin; Future - Lipid Profile; Future - HgB A1c; Future - Hepatic function panel; Future - Thyroid Panel With TSH; Future   Collaboration of Care: Collaboration of Care: Other provider involved in patient's care AEB Shot clinic and PCP  Patient/Guardian was advised Release of Information must be obtained prior to any record release in order to collaborate their care with an outside provider. Patient/Guardian was advised if they have not already done so to contact the registration department to sign all necessary forms  in order for us  to release information regarding their care.   Consent: Patient/Guardian gives verbal consent for treatment and assignment of benefits for services provided during this visit. Patient/Guardian expressed understanding and agreed to proceed.   Follow up in 3 months  Arlyne Bering, NP 09/25/2023, 8:33 AM

## 2023-09-25 NOTE — Progress Notes (Signed)
 Pt tolerated injection of Abilify  maintenna 400mg  given in left deltoid with no complaints. Pt denies all AVH, SI, and HI.    JNL, CMA

## 2023-10-08 ENCOUNTER — Ambulatory Visit (HOSPITAL_COMMUNITY): Payer: Self-pay | Admitting: Psychiatry

## 2023-10-08 NOTE — Progress Notes (Signed)
 Provider discussed thyroid panel. Provider informed patient that his other labs has not resulted. No other concerns noted at this time.

## 2023-10-23 ENCOUNTER — Ambulatory Visit (INDEPENDENT_AMBULATORY_CARE_PROVIDER_SITE_OTHER): Payer: MEDICAID

## 2023-10-23 VITALS — BP 140/80 | HR 70 | Temp 97.9°F | Ht 70.0 in | Wt 145.0 lb

## 2023-10-23 DIAGNOSIS — F250 Schizoaffective disorder, bipolar type: Secondary | ICD-10-CM

## 2023-10-23 MED ORDER — ARIPIPRAZOLE ER 400 MG IM PRSY
400.0000 mg | PREFILLED_SYRINGE | Freq: Once | INTRAMUSCULAR | Status: AC
  Administered 2023-10-23: 400 mg via INTRAMUSCULAR

## 2023-10-23 NOTE — Progress Notes (Cosign Needed Addendum)
 Pt tolerated injection of Abilify  maintenna 400mg  given in right deltoid with no complaints. Pt denies all AVH, SI, and HI. Pt has admitted to taking BP meds late so his BP was slightly high.   JNL, CMA

## 2023-11-20 ENCOUNTER — Ambulatory Visit (INDEPENDENT_AMBULATORY_CARE_PROVIDER_SITE_OTHER): Payer: MEDICAID

## 2023-11-20 VITALS — BP 140/83 | HR 80 | Ht 70.0 in | Wt 150.0 lb

## 2023-11-20 DIAGNOSIS — F250 Schizoaffective disorder, bipolar type: Secondary | ICD-10-CM | POA: Diagnosis not present

## 2023-11-20 MED ORDER — ARIPIPRAZOLE ER 400 MG IM PRSY
400.0000 mg | PREFILLED_SYRINGE | Freq: Once | INTRAMUSCULAR | Status: AC
  Administered 2023-11-20: 400 mg via INTRAMUSCULAR

## 2023-11-20 NOTE — Progress Notes (Cosign Needed)
 Pt tolerated injection of Abilify  maintenna 400mg  given in LEFT-deltoid with no complaints. Pt denies all AVH, SI, and HI. Pt has admitted to taking BP meds late so his BP IS slightly high due to taking them in morning.   JNL, CMA

## 2023-12-18 ENCOUNTER — Encounter (HOSPITAL_COMMUNITY): Payer: Self-pay

## 2023-12-18 ENCOUNTER — Ambulatory Visit (INDEPENDENT_AMBULATORY_CARE_PROVIDER_SITE_OTHER): Payer: MEDICAID

## 2023-12-18 ENCOUNTER — Ambulatory Visit (INDEPENDENT_AMBULATORY_CARE_PROVIDER_SITE_OTHER): Payer: MEDICAID | Admitting: Psychiatry

## 2023-12-18 VITALS — BP 144/78 | HR 74 | Ht 70.0 in | Wt 149.0 lb

## 2023-12-18 VITALS — BP 144/78 | HR 74 | Ht 70.0 in | Wt 149.6 lb

## 2023-12-18 DIAGNOSIS — F250 Schizoaffective disorder, bipolar type: Secondary | ICD-10-CM

## 2023-12-18 MED ORDER — ARIPIPRAZOLE ER 400 MG IM PRSY
400.0000 mg | PREFILLED_SYRINGE | Freq: Once | INTRAMUSCULAR | Status: AC
  Administered 2023-12-18 (×2): 400 mg via INTRAMUSCULAR

## 2023-12-18 NOTE — Progress Notes (Signed)
 BH NP OP Progress Note  12/18/2023 8:58 AM DUC CROCKET  MRN:  [id number]  Chief Complaint: I have been doing okay  HPI: 38 year old male seen today for follow up psychiatric evaluation. He has a psychiatric history of schizoaffective disorder bipolar type. He is managed on Abilify  400 mg monthly. He notes that his medications are effective in managing his psychiatric conditions with only occasional auditory hallucinations that dissipate when he listens to music and walks.   Today he was well groomed, pleasant, cooperative, and engaged in conversation. He stated he was doing good and exercising regularly by walking.  Denied depression and suicidal and homicidal ideations.  Worry at times, no panic attacks.  Appetite is good without over eating.  His sleep is fair with 5-7 hours with naps at times, this appears his norm and declined medications.  Today provider conducted a GAD 7 and patient scored a 1, at his last visit he scored a 3.  Provider also conducted PHQ-9 and patient scored a 1, at his last visit he scored a 1.  He denies mania, visual hallucinations, and paranoia.    No medication changes made today. Patient agreeable to continue medications as prescribed.  No other concerns noted at this time.   Visit Diagnosis:    ICD-10-CM   1. Schizoaffective disorder, bipolar type (HCC)  F25.0         Past Psychiatric History:  schizoaffective disorder bipolar type  Past Medical History: History reviewed. No pertinent past medical history. History reviewed. No pertinent surgical history.  Family Psychiatric History: None reported   Family History: History reviewed. No pertinent family history.  Social History:  Social History   Socioeconomic History   Marital status: Single    Spouse name: Not on file   Number of children: Not on file   Years of education: Not on file   Highest education level: Not on file  Occupational History   Not on file  Tobacco Use   Smoking status:  Never   Smokeless tobacco: Never  Substance and Sexual Activity   Alcohol use: No   Drug use: Never   Sexual activity: Not Currently  Other Topics Concern   Not on file  Social History Narrative   Not on file   Social Drivers of Health   Financial Resource Strain: Not on File (08/25/2021)   Received from [employer]    Financial Resource Strain: 0  Food Insecurity: Not on File (02/01/2023)   Received from [facility] Insecurity    Food: 0  Transportation Needs: Not on File (08/25/2021)   Received from [employer] Needs    Transportation: 0  Physical Activity: Not on File (08/25/2021)   Received [facility]   Physical Activity    Physical Activity: 0  Stress: Not on File (08/25/2021)   Received from [facility]   Stress    Stress: 0  Social Connections: Not on File (02/01/2023)   [facility]   Social Connections    Connectedness: 0    Allergies: Not on File  Metabolic Disorder Labs: No results found for: HGBA1C, MPG Lab Results  Component Value Date   PROLACTIN 0.3 (L) 06/04/2023   No results found for: CHOL, TRIG, HDL, CHOLHDL, VLDL, LDLCALC Lab Results  Component Value Date   TSH 0.87 09/08/2023    Therapeutic Level Labs: No results found for: LITHIUM No results found for: VALPROATE No results found for: CBMZ  Current Medications: Current Outpatient Medications  Medication Sig Dispense Refill   ARIPiprazole  ER (ABILIFY  MAINTENA) 400 MG PRSY prefilled syringe Inject 400 mg into the muscle every 28 (twenty-eight) days. 1 each 11   ARIPiprazole  ER (ABILIFY  MAINTENA) 400 MG SRER injection Inject 2 mLs (400 mg total) into the muscle every 28 (twenty-eight) days. 1 each 11   No current facility-administered medications for this visit.     Musculoskeletal: Strength & Muscle Tone: within normal limits Gait & Station: normal Patient leans: N/A  Psychiatric Specialty Exam: Review of Systems  All other  systems reviewed and are negative.   There were no vitals taken for this visit.There is no height or weight on file to calculate BMI.  General Appearance: Well Groomed  Eye Contact:  Good  Speech:  Clear and Coherent and Normal Rate  Volume:  Normal  Mood:  Euthymic  Affect:  Appropriate and Congruent  Thought Process:  Coherent, Goal Directed, and Linear  Orientation:  Full (Time, Place, and Person)  Thought Content: WDL and Logical   Suicidal Thoughts:  No  Homicidal Thoughts:  No  Memory:  Immediate;   Good Recent;   Good Remote;   Good  Judgement:  Good  Insight:  Good  Psychomotor Activity:  Normal  Concentration:  Concentration: Good and Attention Span: Good  Recall:  Good  Fund of Knowledge: Good  Language: Good  Akathisia:  No  Handed:  Right  AIMS (if indicated):  done, 0  Assets:  Communication Skills Desire for Improvement Financial Resources/Insurance Housing Leisure Time Physical Health Social Support  ADL's:  Intact  Cognition: WNL  Sleep:  Good   Screenings: AIMS    Flowsheet Row Office Visit from 12/18/2023 in [facility] Office Visit from 09/25/2023 in [facility] Clinical Support from 05/17/2023 in [facility] Office Visit from 02/13/2023 in [facility] Office Visit from 11/14/2022 in [facility]  AIMS Total Score 0 0 0 0 0   GAD-7    Flowsheet Row Office Visit from 12/18/2023 in [facility] Office Visit from 09/25/2023 in [facility] Office Visit from 07/31/2023 in [facility] Clinical Support from 05/17/2023 in [facility] Office Visit from 02/13/2023 in [facility]  Total GAD-7 Score 1 3 0 1 3   PHQ2-9    Flowsheet Row Office Visit from 12/18/2023 in [facility] Office Visit from 09/25/2023 in [facility] Office Visit from 07/31/2023 in [facility] Clinical Support from 05/17/2023 in [facility] Office Visit from 02/13/2023 in [facility]  PHQ-2 Total Score 0 1 0 0 2  PHQ-9 Total Score -- 1 1 1 8    Flowsheet Row Office Visit from 09/25/2023 in [facility] Office Visit from 07/31/2023 in [facility] Clinical Support from 05/17/2023 in [facility]  C-SSRS RISK CATEGORY No Risk No Risk No Risk     Assessment and Plan: Patient notes that he is doing well on his current medication regimen. No medication changes made today.  Patient agreeable to continue medication as prescribed.Patient has no recent labs. Labs ordered by the provider of CBC, CMP, Lipid panel, HGB A1c, LFT, and Thyroid panel and completed on 09/08/2023.  1. Schizoaffective disorder, bipolar  type (HCC)  Continue- ARIPiprazole  ER (ABILIFY  MAINTENA) 400 MG SRER injection; Inject 2 mLs (400 mg total) into the muscle every 28 (twenty-eight) days.  Dispense: 1 each; Refill: 11 - CBC w/Diff/Platelet; 2.9 WBC L, MCH 31.4 slightly low, and neutrophils 0.9 L reviewed by MD--will continue to closely monitor and repeat CBC - Comprehensive Metabolic Panel (CMET); WDL - Prolactin; WDL - Lipid Profile; WDL - HgB A1c; WDL - Hepatic function panel; WDL - Thyroid Panel With TSH; WDL   Collaboration of Care: Collaboration of Care: Other provider involved in patient's care AEB Shot clinic and PCP  Patient/Guardian was advised Release of Information must be obtained prior to any record release in order to collaborate their care with an outside provider. Patient/Guardian was advised if they have not already done so to contact the registration department to sign all necessary forms in order for us   to release information regarding their care.   Consent: Patient/Guardian gives verbal consent for treatment and assignment of benefits for services provided during this visit. Patient/Guardian expressed understanding and agreed to proceed.   Follow up in 3 months  Sharlot Becker, NP 12/18/2023, 8:58 AM

## 2023-12-18 NOTE — Progress Notes (Cosign Needed)
 Pt tolerated injection of Abilify  maintenna 400mg  given in right deltoid with no complaints. Pt denies all AVH, SI, and HI. Pt has admitted to taking BP meds late so his BP was slightly high.   JNL, CMA

## 2023-12-19 ENCOUNTER — Other Ambulatory Visit (HOSPITAL_COMMUNITY): Payer: Self-pay | Admitting: Psychiatry

## 2023-12-19 DIAGNOSIS — F250 Schizoaffective disorder, bipolar type: Secondary | ICD-10-CM

## 2023-12-19 NOTE — Progress Notes (Signed)
 Ordered a repeat CBC with diff.

## 2023-12-24 ENCOUNTER — Other Ambulatory Visit (HOSPITAL_COMMUNITY): Payer: MEDICAID

## 2024-01-15 ENCOUNTER — Ambulatory Visit (HOSPITAL_COMMUNITY): Payer: MEDICAID

## 2024-01-15 ENCOUNTER — Encounter (HOSPITAL_COMMUNITY): Payer: Self-pay

## 2024-01-16 ENCOUNTER — Other Ambulatory Visit (HOSPITAL_COMMUNITY): Payer: MEDICAID

## 2024-01-22 ENCOUNTER — Encounter (HOSPITAL_COMMUNITY): Payer: Self-pay

## 2024-01-22 ENCOUNTER — Ambulatory Visit (INDEPENDENT_AMBULATORY_CARE_PROVIDER_SITE_OTHER): Payer: MEDICAID

## 2024-01-22 DIAGNOSIS — F250 Schizoaffective disorder, bipolar type: Secondary | ICD-10-CM

## 2024-01-22 MED ORDER — ARIPIPRAZOLE ER 400 MG IM PRSY
400.0000 mg | PREFILLED_SYRINGE | Freq: Once | INTRAMUSCULAR | Status: AC
  Administered 2024-01-22: 400 mg via INTRAMUSCULAR

## 2024-01-22 NOTE — Progress Notes (Signed)
 Patient Arrived for Monthly ARIPiprazole  ER (ABILIFY  MAINTENA) 400 MG Injection Tolerated injection well in left  Arm. Pleasant as Always NO HI/SI   NOR AH/VH

## 2024-02-19 ENCOUNTER — Ambulatory Visit (INDEPENDENT_AMBULATORY_CARE_PROVIDER_SITE_OTHER): Payer: MEDICAID

## 2024-02-19 ENCOUNTER — Encounter (HOSPITAL_COMMUNITY): Payer: Self-pay

## 2024-02-19 VITALS — BP 142/87 | HR 75 | Temp 98.9°F | Ht 70.0 in | Wt 146.6 lb

## 2024-02-19 DIAGNOSIS — F250 Schizoaffective disorder, bipolar type: Secondary | ICD-10-CM | POA: Diagnosis not present

## 2024-02-19 DIAGNOSIS — G4700 Insomnia, unspecified: Secondary | ICD-10-CM

## 2024-02-19 DIAGNOSIS — F200 Paranoid schizophrenia: Secondary | ICD-10-CM

## 2024-02-19 DIAGNOSIS — F411 Generalized anxiety disorder: Secondary | ICD-10-CM

## 2024-02-19 MED ORDER — ARIPIPRAZOLE ER 400 MG IM PRSY
400.0000 mg | PREFILLED_SYRINGE | Freq: Once | INTRAMUSCULAR | Status: AC
  Administered 2024-02-19: 400 mg via INTRAMUSCULAR

## 2024-02-19 NOTE — Progress Notes (Cosign Needed)
 Pt came into the office for his due abilify  maintena 400mg .  Pt was clean and talkative. Pt states that he has no thought of hurting himself or anyone else. Pt was given the injection into the rt deltoid.   Injection was prepared and given by harlene fridge, cma.  NDC 40851-927-19   GTTN # [id number],  exp [id number] lot # [id number]

## 2024-02-19 NOTE — Patient Instructions (Signed)
 Pt came into the office for his due abilify  maintena 400mg .  Pt was clean and talkative. Pt states that he has no thought of hurting himself or anyone else. Pt was given the injection into the rt deltoid.   Injection was prepared and given by harlene fridge, cma.  NDC 40851-927-19   GTTN # [id number],  exp [id number] lot # [id number]

## 2024-03-18 ENCOUNTER — Encounter (HOSPITAL_COMMUNITY): Payer: Self-pay

## 2024-03-18 ENCOUNTER — Ambulatory Visit (INDEPENDENT_AMBULATORY_CARE_PROVIDER_SITE_OTHER): Payer: MEDICAID | Admitting: Psychiatry

## 2024-03-18 ENCOUNTER — Ambulatory Visit (INDEPENDENT_AMBULATORY_CARE_PROVIDER_SITE_OTHER): Payer: MEDICAID

## 2024-03-18 VITALS — BP 158/79 | HR 84 | Wt 146.2 lb

## 2024-03-18 DIAGNOSIS — F200 Paranoid schizophrenia: Secondary | ICD-10-CM | POA: Diagnosis not present

## 2024-03-18 DIAGNOSIS — F250 Schizoaffective disorder, bipolar type: Secondary | ICD-10-CM

## 2024-03-18 MED ORDER — ABILIFY MAINTENA 400 MG IM PRSY: 400.0000 mg | PREFILLED_SYRINGE | INTRAMUSCULAR | 11 refills | Status: AC

## 2024-03-18 MED ORDER — ABILIFY MAINTENA 400 MG IM PRSY: 400.0000 mg | PREFILLED_SYRINGE | INTRAMUSCULAR | 11 refills | Status: DC

## 2024-03-18 NOTE — Progress Notes (Deleted)
 BH MD/PA/NP OP Progress Note  03/18/2024 8:55 AM Hector Wright  MRN:  [id number] Chief Complaint: I'm fine  HPI: 38 year old male seen today for follow up psychiatric evaluation. He has a psychiatric history of schizoaffective disorder bipolar type. He is managed on Abilify  400 mg monthly. He notes that his medications are effective in managing his psychiatric conditions.   Today he was well groom, pleasant, cooperative, and engaged in conversation. He informed clinical research associate that he has fine.  He informed clinical research associate that his mood is stable and reports that he has minimal anxiety and depression.   Today provider conducted a GAD 7 and patient scored a 3. Provider also conducted PHQ-9 and patient scored a 2. He endorses adequate sleep and appetite.  He denies SI/HI/VAH, mania or paranoia.    Patient notes that he was worried about his grandmother who recently had surgery. He notes that she is recovering in a nursing home.  Today provider conducted an AIMS assessment and patient scored a 0.   No medication changes made today. Patient agreeable to continue medications as prescribed.  No other concerns noted at this time.    Visit Diagnosis:    ICD-10-CM   1. Schizophrenia, paranoid (HCC)  F20.0     2. Schizoaffective disorder, bipolar type (HCC)  F25.0 ARIPiprazole  ER (ABILIFY  MAINTENA) 400 MG PRSY prefilled syringe         Past Psychiatric History:  schizoaffective disorder bipolar type  Past Medical History: History reviewed. No pertinent past medical history. History reviewed. No pertinent surgical history.  Family Psychiatric History: None reported   Family History: History reviewed. No pertinent family history.  Social History:  Social History   Socioeconomic History   Marital status: Single    Spouse name: Not on file   Number of children: Not on file   Years of education: Not on file   Highest education level: Not on file  Occupational History   Not on file  Tobacco Use    Smoking status: Never   Smokeless tobacco: Never  Substance and Sexual Activity   Alcohol use: No   Drug use: Never   Sexual activity: Not Currently  Other Topics Concern   Not on file  Social History Narrative   Not on file   Social Drivers of Health   Financial Resource Strain: Not on File (08/25/2021)   Received from [employer]    Financial Resource Strain: 0  Food Insecurity: Not on File (02/01/2023)   Received from [facility] Insecurity    Food: 0  Transportation Needs: Not on File (08/25/2021)   Received from [employer] Needs    Transportation: 0  Physical Activity: Not on File (08/25/2021)   Received [facility]   Physical Activity    Physical Activity: 0  Stress: Not on File (08/25/2021)   Received from [facility]   Stress    Stress: 0  Social Connections: Not on File (02/01/2023)   Received from [facility]   Social Connections    Connectedness: 0    Allergies: No Known Allergies  Metabolic Disorder Labs: No results found for: HGBA1C, MPG Lab Results  Component Value Date   PROLACTIN 0.3 (L) 06/04/2023   No results found for: CHOL, TRIG, HDL, CHOLHDL, VLDL, LDLCALC Lab Results  Component Value Date   TSH 0.87 09/08/2023    Therapeutic Level Labs: No results found for: LITHIUM No results found for: VALPROATE No results found for: CBMZ  Current Medications: Current Outpatient Medications  Medication Sig Dispense Refill   ARIPiprazole  ER (ABILIFY  MAINTENA) 400 MG PRSY prefilled syringe Inject 400 mg into the muscle every 28 (twenty-eight) days. 1 each 11   ARIPiprazole  ER (ABILIFY  MAINTENA) 400 MG SRER injection Inject 2 mLs (400 mg total) into the muscle every 28 (twenty-eight) days. 1 each 11   No current facility-administered medications for this visit.     Musculoskeletal: Strength & Muscle Tone: within normal limits Gait & Station: normal Patient leans: N/A  Psychiatric Specialty Exam: Review  of Systems  Blood pressure (!) 158/79, pulse 84, weight 146 lb 3.2 oz (66.3 kg), SpO2 100%.Body mass index is 20.98 kg/m.  General Appearance: Well Groomed  Eye Contact:  Good  Speech:  Clear and Coherent and Normal Rate  Volume:  Normal  Mood:  Euthymic  Affect:  Appropriate and Congruent  Thought Process:  Coherent, Goal Directed, and Linear  Orientation:  Full (Time, Place, and Person)  Thought Content: WDL and Logical   Suicidal Thoughts:  No  Homicidal Thoughts:  No  Memory:  Immediate;   Good Recent;   Good Remote;   Good  Judgement:  Good  Insight:  Good  Psychomotor Activity:  Normal  Concentration:  Concentration: Good and Attention Span: Good  Recall:  Good  Fund of Knowledge: Good  Language: Good  Akathisia:  No  Handed:  Right  AIMS (if indicated):  done, 0  Assets:  Communication Skills Desire for Improvement Financial Resources/Insurance Housing Leisure Time Physical Health Social Support  ADL's:  Intact  Cognition: WNL  Sleep:  Good   Screenings: AIMS    Flowsheet Row Clinical Support from 03/18/2024 in [facility] Office Visit from 12/18/2023 in [facility] Office Visit from 09/25/2023 in [facility] Clinical Support from 05/17/2023 in [facility] Office Visit from 02/13/2023 in [facility]  AIMS Total Score 0 0 0 0 0   GAD-7    Flowsheet Row Clinical Support from 03/18/2024 in [facility] Office Visit from 12/18/2023 in [facility] Office Visit from 09/25/2023 in [facility] Office Visit from 07/31/2023 in [facility] Clinical Support from 05/17/2023 in [facility]  Total GAD-7 Score 3 1 3  0 1   PHQ2-9    Flowsheet Row Clinical Support from 03/18/2024 in [facility] Office Visit from 12/18/2023 in [facility] Office Visit from 09/25/2023 in [facility] Office Visit from 07/31/2023 in [facility] Clinical Support from 05/17/2023 in [facility]  PHQ-2 Total Score 1 0 1 0 0  PHQ-9 Total Score 2 -- 1 1 1    Flowsheet Row Office Visit from 09/25/2023 in [facility] Office Visit from 07/31/2023 in [facility] Clinical Support from 05/17/2023 in [facility]  C-SSRS RISK CATEGORY No Risk No Risk No Risk     Assessment and Plan: Patient notes that he is doing well on his current medication regimen. No medication changes made today.  Patient agreeable to continue medication as prescribed. 1. Schizoaffective disorder, bipolar type (HCC)  Continue- ARIPiprazole  ER (ABILIFY  MAINTENA) 400 MG SRER injection; Inject 2 mLs (400 mg total) into the muscle every 28 (twenty-eight) days.  Dispense: 1 each; Refill: 11  Collaboration of Care: Collaboration of Care: Other provider involved in patient's care AEB Shot clinic and PCP  Patient/Guardian was advised Release of Information must be obtained prior to any record release in order to collaborate their care with an outside provider. Patient/Guardian was advised if they have not already done so to contact the registration department to sign all necessary forms in order for us  to release information regarding their care.   Consent: Patient/Guardian gives verbal consent for treatment and assignment of benefits for services provided during this visit. Patient/Guardian expressed understanding and agreed to proceed.   Follow up in 3 months  Zane FORBES Bach, NP 03/18/2024, 8:55 AM

## 2024-03-18 NOTE — Progress Notes (Signed)
 Patient Arrived for Monthly ARIPiprazole  ER (ABILIFY  MAINTENA) 400 MG Injection Tolerated injection well in left  Arm. Pleasant as Always NO HI/SI   NOR AH/VH

## 2024-03-18 NOTE — Progress Notes (Signed)
 BH MD/PA/NP OP Progress Note  03/18/2024 8:58 AM PAU BANH  MRN:  [id number] Chief Complaint: I have been fine  HPI:  38 year old male seen today for follow up psychiatric evaluation. He has a psychiatric history of schizoaffective disorder bipolar type. He is managed on Abilify  400 mg monthly. He notes that his medications are effective in managing his psychiatric conditions.    Today he was well groom, pleasant, cooperative, and engaged in conversation. He informed clinical research associate that he has fine.  He informed clinical research associate that his mood is stable and reports that he has minimal anxiety and depression.   Today provider conducted a GAD 7 and patient scored a 3. Provider also conducted PHQ-9 and patient scored a 2. He endorses adequate sleep and appetite.  He denies SI/HI/VAH, mania or paranoia.     Patient notes that he was worried about his grandmother who recently had surgery. He notes that she is recovering in a nursing home.   Today provider conducted an AIMS assessment and patient scored a 0.    No medication changes made today. Patient agreeable to continue medications as prescribed.  No other concerns noted at this time.    Visit Diagnosis:    ICD-10-CM   1. Schizoaffective disorder, bipolar type (HCC)  F25.0 ARIPiprazole  ER (ABILIFY  MAINTENA) 400 MG PRSY prefilled syringe         Past Psychiatric History:  schizoaffective disorder bipolar type  Past Medical History: No past medical history on file. No past surgical history on file.  Family Psychiatric History: None reported   Family History: No family history on file.  Social History:  Social History   Socioeconomic History   Marital status: Single    Spouse name: Not on file   Number of children: Not on file   Years of education: Not on file   Highest education level: Not on file  Occupational History   Not on file  Tobacco Use   Smoking status: Never   Smokeless tobacco: Never  Substance and Sexual Activity    Alcohol use: No   Drug use: Never   Sexual activity: Not Currently  Other Topics Concern   Not on file  Social History Narrative   Not on file   Social Drivers of Health   Financial Resource Strain: Not on File (08/25/2021)   Received from [employer]    Financial Resource Strain: 0  Food Insecurity: Not on File (02/01/2023)   Received from [facility]    Food: 0  Transportation Needs: Not on File (08/25/2021)   Received from [employer] [facility]    Transportation: 0  Physical Activity: Not on File (08/25/2021)   Received from [facility]   Physical Activity    Physical Activity: 0  Stress: Not on File (08/25/2021)   Received from [facility]   Stress    Stress: 0  Social Connections: Not on File (02/01/2023)   Received from [facility]   Social Connections    Connectedness: 0    Allergies: No Known Allergies  Metabolic Disorder Labs: No results found for: HGBA1C, MPG Lab Results  Component Value Date   PROLACTIN 0.3 (L) 06/04/2023   No results found for: CHOL, TRIG, HDL, CHOLHDL, VLDL, LDLCALC Lab Results  Component Value Date   TSH 0.87 09/08/2023    Therapeutic Level Labs: No results found for: LITHIUM No results found for: VALPROATE No results found for: CBMZ  Current Medications: Current Outpatient  Medications  Medication Sig Dispense Refill   ARIPiprazole  ER (ABILIFY  MAINTENA) 400 MG PRSY prefilled syringe Inject 400 mg into the muscle every 28 (twenty-eight) days. 1 each 11   ARIPiprazole  ER (ABILIFY  MAINTENA) 400 MG SRER injection Inject 2 mLs (400 mg total) into the muscle every 28 (twenty-eight) days. 1 each 11   No current facility-administered medications for this visit.     Musculoskeletal: Strength & Muscle Tone: within normal limits Gait & Station: normal Patient leans: N/A  Psychiatric Specialty Exam: Review of Systems  There were no vitals taken for this visit.There is no height or weight  on file to calculate BMI.  General Appearance: Well Groomed  Eye Contact:  Good  Speech:  Clear and Coherent and Normal Rate  Volume:  Normal  Mood:  Euthymic  Affect:  Appropriate and Congruent  Thought Process:  Coherent, Goal Directed, and Linear  Orientation:  Full (Time, Place, and Person)  Thought Content: WDL and Logical   Suicidal Thoughts:  No  Homicidal Thoughts:  No  Memory:  Immediate;   Good Recent;   Good Remote;   Good  Judgement:  Good  Insight:  Good  Psychomotor Activity:  Normal  Concentration:  Concentration: Good and Attention Span: Good  Recall:  Good  Fund of Knowledge: Good  Language: Good  Akathisia:  No  Handed:  Right  AIMS (if indicated):  done, 0  Assets:  Communication Skills Desire for Improvement Financial Resources/Insurance Housing Leisure Time Physical Health Social Support  ADL's:  Intact  Cognition: WNL  Sleep:  Good   Screenings: AIMS    Flowsheet Row Clinical Support from 03/18/2024 in [facility] Office Visit from 12/18/2023 in [facility] Office Visit from 09/25/2023 in [facility] Clinical Support from 05/17/2023 in [facility] Office Visit from 02/13/2023 in [facility]  AIMS Total Score 0 0 0 0 0   GAD-7    Flowsheet Row Clinical Support from 03/18/2024 in [facility] Office Visit from 12/18/2023 in [facility] Office Visit from 09/25/2023 in [facility] Office Visit from 07/31/2023 in [facility] Clinical Support from 05/17/2023 in [facility]  Total GAD-7 Score 3 1 3  0 1   PHQ2-9    Flowsheet Row Clinical Support from 03/18/2024 in [facility] Office Visit from [facility]  Office Visit from 09/25/2023 in [facility] Office Visit from 07/31/2023 in [facility] Clinical Support from 05/17/2023 in [facility]  PHQ-2 Total Score 1 0 1 0 0  PHQ-9 Total Score 2 -- 1 1 1    Flowsheet Row Office Visit from 09/25/2023 in [facility] Office Visit from 07/31/2023 in [facility] Clinical Support from 05/17/2023 in [facility]  C-SSRS RISK CATEGORY No Risk No Risk No Risk     Assessment and Plan: Patient notes that he is doing well on his current medication regimen. 1. Schizoaffective disorder, bipolar type (HCC)  Continue- ARIPiprazole  ER (ABILIFY  MAINTENA) 400 MG SRER injection; Inject 2 mLs    Collaboration of Care: Collaboration of Care: Other provider involved in patient's care AEB Shot clinic and PCP  Patient/Guardian was advised Release of Information must be obtained prior to any record release in  order to collaborate their care with an outside provider. Patient/Guardian was advised if they have not already done so to contact the registration department to sign all necessary forms in order for us  to release information regarding their care.   Consent: Patient/Guardian gives verbal consent for treatment and assignment of benefits for services provided during this visit. Patient/Guardian expressed understanding and agreed to proceed.   Follow up in 3 months  Zane FORBES Bach, NP 03/18/2024, 8:58 AM

## 2024-04-15 ENCOUNTER — Ambulatory Visit (INDEPENDENT_AMBULATORY_CARE_PROVIDER_SITE_OTHER): Payer: MEDICAID

## 2024-04-15 VITALS — BP 137/84 | HR 87 | Ht 70.0 in | Wt 150.0 lb

## 2024-04-15 DIAGNOSIS — F250 Schizoaffective disorder, bipolar type: Secondary | ICD-10-CM

## 2024-04-15 MED ORDER — ARIPIPRAZOLE ER 400 MG IM PRSY
400.0000 mg | PREFILLED_SYRINGE | INTRAMUSCULAR | Status: AC
  Administered 2024-04-15 – 2024-06-12 (×3): 400 mg via INTRAMUSCULAR

## 2024-04-15 NOTE — Progress Notes (Signed)
 Patient presented to the office for injection of abilify  Maintena 400mg . Patient denies SI/HI/AVH. Patient received injection in right deltoid. Patient tolerated injection well. Patient presented well groomed and with appropriate affect. Patient denies any concern, patient will return in 28 days. Patient left office alert and ambulatory.

## 2024-05-15 ENCOUNTER — Encounter (HOSPITAL_COMMUNITY): Payer: Self-pay

## 2024-05-15 ENCOUNTER — Ambulatory Visit (INDEPENDENT_AMBULATORY_CARE_PROVIDER_SITE_OTHER): Payer: MEDICAID

## 2024-05-15 VITALS — BP 137/71 | HR 75 | Wt 148.2 lb

## 2024-05-15 DIAGNOSIS — F200 Paranoid schizophrenia: Secondary | ICD-10-CM | POA: Diagnosis not present

## 2024-05-15 DIAGNOSIS — F411 Generalized anxiety disorder: Secondary | ICD-10-CM | POA: Diagnosis not present

## 2024-05-15 NOTE — Progress Notes (Signed)
 Patient Arrived for Monthly ARIPiprazole  ER (ABILIFY  MAINTENA) 400 MG Injection Tolerated injection well in right Arm. Pleasant as Always NO HI/SI   NOR AH/VH

## 2024-06-12 ENCOUNTER — Ambulatory Visit (INDEPENDENT_AMBULATORY_CARE_PROVIDER_SITE_OTHER): Payer: MEDICAID

## 2024-06-12 ENCOUNTER — Encounter (HOSPITAL_COMMUNITY): Payer: Self-pay

## 2024-06-12 VITALS — BP 139/75 | HR 89 | Temp 98.1°F | Ht 70.0 in | Wt 150.8 lb

## 2024-06-12 DIAGNOSIS — F250 Schizoaffective disorder, bipolar type: Secondary | ICD-10-CM

## 2024-06-12 NOTE — Progress Notes (Signed)
 Pt presented for Injection of Abilify  400 mg   in Left Deltoid. Pt tolerated injection well. Pt denies SI/HI/AVH. No additional concerns. Pt will return in 28 days.   CJT- CMA

## 2024-07-10 ENCOUNTER — Ambulatory Visit (HOSPITAL_COMMUNITY): Payer: MEDICAID
